# Patient Record
Sex: Male | Born: 1960 | Race: Black or African American | Hispanic: No | Marital: Single | Smoking: Former smoker
Health system: Southern US, Community
[De-identification: ages and names within clinical notes are randomized; demographics above are authoritative.]

## PROBLEM LIST (undated history)

## (undated) DIAGNOSIS — R0789 Other chest pain: Secondary | ICD-10-CM

## (undated) DIAGNOSIS — T7840XA Allergy, unspecified, initial encounter: Secondary | ICD-10-CM

## (undated) DIAGNOSIS — I10 Essential (primary) hypertension: Secondary | ICD-10-CM

## (undated) DIAGNOSIS — R0609 Other forms of dyspnea: Secondary | ICD-10-CM

## (undated) DIAGNOSIS — D649 Anemia, unspecified: Secondary | ICD-10-CM

## (undated) DIAGNOSIS — K5730 Diverticulosis of large intestine without perforation or abscess without bleeding: Secondary | ICD-10-CM

## (undated) DIAGNOSIS — J40 Bronchitis, not specified as acute or chronic: Secondary | ICD-10-CM

## (undated) DIAGNOSIS — F32A Depression, unspecified: Secondary | ICD-10-CM

## (undated) DIAGNOSIS — G5600 Carpal tunnel syndrome, unspecified upper limb: Secondary | ICD-10-CM

## (undated) DIAGNOSIS — K219 Gastro-esophageal reflux disease without esophagitis: Secondary | ICD-10-CM

## (undated) DIAGNOSIS — K921 Melena: Secondary | ICD-10-CM

## (undated) DIAGNOSIS — M545 Low back pain, unspecified: Secondary | ICD-10-CM

## (undated) DIAGNOSIS — M1990 Unspecified osteoarthritis, unspecified site: Secondary | ICD-10-CM

## (undated) DIAGNOSIS — R0600 Dyspnea, unspecified: Secondary | ICD-10-CM

## (undated) DIAGNOSIS — E785 Hyperlipidemia, unspecified: Secondary | ICD-10-CM

## (undated) DIAGNOSIS — R740 Nonspecific elevation of levels of transaminase and lactic acid dehydrogenase [LDH]: Secondary | ICD-10-CM

## (undated) DIAGNOSIS — J039 Acute tonsillitis, unspecified: Secondary | ICD-10-CM

## (undated) DIAGNOSIS — N400 Enlarged prostate without lower urinary tract symptoms: Secondary | ICD-10-CM

## (undated) DIAGNOSIS — R7401 Elevation of levels of liver transaminase levels: Secondary | ICD-10-CM

## (undated) DIAGNOSIS — E119 Type 2 diabetes mellitus without complications: Secondary | ICD-10-CM

## (undated) DIAGNOSIS — F329 Major depressive disorder, single episode, unspecified: Secondary | ICD-10-CM

## (undated) DIAGNOSIS — E669 Obesity, unspecified: Secondary | ICD-10-CM

## (undated) DIAGNOSIS — I499 Cardiac arrhythmia, unspecified: Secondary | ICD-10-CM

## (undated) DIAGNOSIS — N509 Disorder of male genital organs, unspecified: Secondary | ICD-10-CM

## (undated) DIAGNOSIS — J329 Chronic sinusitis, unspecified: Secondary | ICD-10-CM

## (undated) DIAGNOSIS — D126 Benign neoplasm of colon, unspecified: Secondary | ICD-10-CM

## (undated) DIAGNOSIS — G8929 Other chronic pain: Secondary | ICD-10-CM

## (undated) HISTORY — DX: Elevation of levels of liver transaminase levels: R74.01

## (undated) HISTORY — DX: Gastro-esophageal reflux disease without esophagitis: K21.9

## (undated) HISTORY — PX: OTHER SURGICAL HISTORY: SHX169

## (undated) HISTORY — DX: Dyspnea, unspecified: R06.00

## (undated) HISTORY — DX: Diverticulosis of large intestine without perforation or abscess without bleeding: K57.30

## (undated) HISTORY — DX: Unspecified osteoarthritis, unspecified site: M19.90

## (undated) HISTORY — DX: Type 2 diabetes mellitus without complications: E11.9

## (undated) HISTORY — DX: Hyperlipidemia, unspecified: E78.5

## (undated) HISTORY — DX: Obesity, unspecified: E66.9

## (undated) HISTORY — DX: Depression, unspecified: F32.A

## (undated) HISTORY — DX: Low back pain, unspecified: M54.50

## (undated) HISTORY — DX: Carpal tunnel syndrome, unspecified upper limb: G56.00

## (undated) HISTORY — DX: Chronic sinusitis, unspecified: J32.9

## (undated) HISTORY — DX: Major depressive disorder, single episode, unspecified: F32.9

## (undated) HISTORY — DX: Other chest pain: R07.89

## (undated) HISTORY — DX: Benign neoplasm of colon, unspecified: D12.6

## (undated) HISTORY — DX: Other chronic pain: G89.29

## (undated) HISTORY — DX: Allergy, unspecified, initial encounter: T78.40XA

## (undated) HISTORY — DX: Essential (primary) hypertension: I10

## (undated) HISTORY — DX: Benign prostatic hyperplasia without lower urinary tract symptoms: N40.0

## (undated) HISTORY — DX: Low back pain: M54.5

## (undated) HISTORY — DX: Other forms of dyspnea: R06.09

## (undated) HISTORY — DX: Cardiac arrhythmia, unspecified: I49.9

## (undated) HISTORY — DX: Anemia, unspecified: D64.9

## (undated) HISTORY — DX: Disorder of male genital organs, unspecified: N50.9

## (undated) HISTORY — DX: Melena: K92.1

## (undated) HISTORY — DX: Acute tonsillitis, unspecified: J03.90

## (undated) HISTORY — DX: Nonspecific elevation of levels of transaminase and lactic acid dehydrogenase (ldh): R74.0

---

## 1999-08-01 ENCOUNTER — Encounter: Admission: RE | Admit: 1999-08-01 | Discharge: 1999-08-01 | Payer: Self-pay | Admitting: Internal Medicine

## 1999-08-15 ENCOUNTER — Encounter: Admission: RE | Admit: 1999-08-15 | Discharge: 1999-11-13 | Payer: Self-pay | Admitting: Internal Medicine

## 1999-09-12 ENCOUNTER — Encounter: Admission: RE | Admit: 1999-09-12 | Discharge: 1999-09-12 | Payer: Self-pay | Admitting: Internal Medicine

## 2000-04-03 ENCOUNTER — Encounter: Admission: RE | Admit: 2000-04-03 | Discharge: 2000-04-03 | Payer: Self-pay | Admitting: Internal Medicine

## 2000-04-25 ENCOUNTER — Inpatient Hospital Stay (HOSPITAL_COMMUNITY): Admission: EM | Admit: 2000-04-25 | Discharge: 2000-04-26 | Payer: Self-pay | Admitting: Emergency Medicine

## 2000-04-25 ENCOUNTER — Encounter: Payer: Self-pay | Admitting: Emergency Medicine

## 2000-04-30 ENCOUNTER — Emergency Department (HOSPITAL_COMMUNITY): Admission: EM | Admit: 2000-04-30 | Discharge: 2000-04-30 | Payer: Self-pay | Admitting: Emergency Medicine

## 2000-04-30 ENCOUNTER — Encounter: Payer: Self-pay | Admitting: Emergency Medicine

## 2000-07-16 ENCOUNTER — Encounter: Admission: RE | Admit: 2000-07-16 | Discharge: 2000-07-16 | Payer: Self-pay | Admitting: Internal Medicine

## 2000-08-20 ENCOUNTER — Encounter: Admission: RE | Admit: 2000-08-20 | Discharge: 2000-08-20 | Payer: Self-pay | Admitting: Internal Medicine

## 2000-09-17 ENCOUNTER — Encounter: Admission: RE | Admit: 2000-09-17 | Discharge: 2000-09-17 | Payer: Self-pay | Admitting: Internal Medicine

## 2000-10-28 ENCOUNTER — Ambulatory Visit (HOSPITAL_COMMUNITY): Admission: RE | Admit: 2000-10-28 | Discharge: 2000-10-28 | Payer: Self-pay | Admitting: Cardiology

## 2000-11-04 ENCOUNTER — Ambulatory Visit (HOSPITAL_COMMUNITY): Admission: RE | Admit: 2000-11-04 | Discharge: 2000-11-04 | Payer: Self-pay | Admitting: Cardiology

## 2001-01-07 ENCOUNTER — Encounter: Admission: RE | Admit: 2001-01-07 | Discharge: 2001-01-07 | Payer: Self-pay | Admitting: Internal Medicine

## 2001-02-18 ENCOUNTER — Encounter: Admission: RE | Admit: 2001-02-18 | Discharge: 2001-02-18 | Payer: Self-pay | Admitting: Internal Medicine

## 2001-02-26 ENCOUNTER — Ambulatory Visit (HOSPITAL_COMMUNITY): Admission: RE | Admit: 2001-02-26 | Discharge: 2001-02-26 | Payer: Self-pay | Admitting: Internal Medicine

## 2001-02-26 ENCOUNTER — Encounter: Admission: RE | Admit: 2001-02-26 | Discharge: 2001-02-26 | Payer: Self-pay | Admitting: Internal Medicine

## 2001-03-11 ENCOUNTER — Emergency Department (HOSPITAL_COMMUNITY): Admission: EM | Admit: 2001-03-11 | Discharge: 2001-03-11 | Payer: Self-pay | Admitting: Emergency Medicine

## 2001-03-13 ENCOUNTER — Encounter: Payer: Self-pay | Admitting: Internal Medicine

## 2001-03-13 ENCOUNTER — Ambulatory Visit (HOSPITAL_COMMUNITY): Admission: RE | Admit: 2001-03-13 | Discharge: 2001-03-13 | Payer: Self-pay | Admitting: Internal Medicine

## 2001-03-13 ENCOUNTER — Encounter: Admission: RE | Admit: 2001-03-13 | Discharge: 2001-03-13 | Payer: Self-pay | Admitting: Internal Medicine

## 2001-03-25 ENCOUNTER — Encounter: Admission: RE | Admit: 2001-03-25 | Discharge: 2001-03-25 | Payer: Self-pay | Admitting: Internal Medicine

## 2001-08-11 ENCOUNTER — Encounter: Admission: RE | Admit: 2001-08-11 | Discharge: 2001-08-11 | Payer: Self-pay | Admitting: Internal Medicine

## 2002-02-23 ENCOUNTER — Encounter: Admission: RE | Admit: 2002-02-23 | Discharge: 2002-02-23 | Payer: Self-pay | Admitting: Internal Medicine

## 2002-02-25 ENCOUNTER — Ambulatory Visit (HOSPITAL_BASED_OUTPATIENT_CLINIC_OR_DEPARTMENT_OTHER): Admission: RE | Admit: 2002-02-25 | Discharge: 2002-02-25 | Payer: Self-pay | Admitting: Orthopedic Surgery

## 2002-02-25 HISTORY — PX: CARPAL TUNNEL RELEASE: SHX101

## 2002-03-10 ENCOUNTER — Encounter: Admission: RE | Admit: 2002-03-10 | Discharge: 2002-03-10 | Payer: Self-pay | Admitting: Internal Medicine

## 2002-04-14 ENCOUNTER — Ambulatory Visit (HOSPITAL_COMMUNITY): Admission: RE | Admit: 2002-04-14 | Discharge: 2002-04-14 | Payer: Self-pay | Admitting: Internal Medicine

## 2002-04-14 ENCOUNTER — Encounter: Admission: RE | Admit: 2002-04-14 | Discharge: 2002-04-14 | Payer: Self-pay | Admitting: Internal Medicine

## 2002-04-14 ENCOUNTER — Encounter: Payer: Self-pay | Admitting: Internal Medicine

## 2002-07-21 ENCOUNTER — Encounter: Admission: RE | Admit: 2002-07-21 | Discharge: 2002-07-21 | Payer: Self-pay | Admitting: Internal Medicine

## 2002-09-08 ENCOUNTER — Encounter: Admission: RE | Admit: 2002-09-08 | Discharge: 2002-09-08 | Payer: Self-pay | Admitting: Internal Medicine

## 2002-09-09 ENCOUNTER — Encounter: Admission: RE | Admit: 2002-09-09 | Discharge: 2002-09-09 | Payer: Self-pay | Admitting: Internal Medicine

## 2002-11-25 ENCOUNTER — Encounter: Admission: RE | Admit: 2002-11-25 | Discharge: 2002-11-25 | Payer: Self-pay | Admitting: Internal Medicine

## 2003-02-03 ENCOUNTER — Encounter: Admission: RE | Admit: 2003-02-03 | Discharge: 2003-02-03 | Payer: Self-pay | Admitting: Internal Medicine

## 2003-07-27 ENCOUNTER — Encounter: Admission: RE | Admit: 2003-07-27 | Discharge: 2003-07-27 | Payer: Self-pay | Admitting: Internal Medicine

## 2003-11-23 ENCOUNTER — Ambulatory Visit: Payer: Self-pay | Admitting: Internal Medicine

## 2004-06-27 ENCOUNTER — Ambulatory Visit: Payer: Self-pay | Admitting: Internal Medicine

## 2004-07-02 ENCOUNTER — Ambulatory Visit: Payer: Self-pay | Admitting: Internal Medicine

## 2004-12-20 ENCOUNTER — Ambulatory Visit: Payer: Self-pay | Admitting: Internal Medicine

## 2005-01-08 ENCOUNTER — Ambulatory Visit: Payer: Self-pay | Admitting: Internal Medicine

## 2005-07-03 ENCOUNTER — Ambulatory Visit: Payer: Self-pay | Admitting: Internal Medicine

## 2005-12-04 DIAGNOSIS — Z9189 Other specified personal risk factors, not elsewhere classified: Secondary | ICD-10-CM | POA: Insufficient documentation

## 2005-12-04 DIAGNOSIS — G5600 Carpal tunnel syndrome, unspecified upper limb: Secondary | ICD-10-CM

## 2005-12-04 DIAGNOSIS — K5900 Constipation, unspecified: Secondary | ICD-10-CM | POA: Insufficient documentation

## 2005-12-04 DIAGNOSIS — I10 Essential (primary) hypertension: Secondary | ICD-10-CM | POA: Insufficient documentation

## 2005-12-04 DIAGNOSIS — F32A Depression, unspecified: Secondary | ICD-10-CM | POA: Insufficient documentation

## 2005-12-04 DIAGNOSIS — R0609 Other forms of dyspnea: Secondary | ICD-10-CM

## 2005-12-04 DIAGNOSIS — F329 Major depressive disorder, single episode, unspecified: Secondary | ICD-10-CM

## 2005-12-04 DIAGNOSIS — R0989 Other specified symptoms and signs involving the circulatory and respiratory systems: Secondary | ICD-10-CM

## 2005-12-04 DIAGNOSIS — M545 Low back pain: Secondary | ICD-10-CM

## 2005-12-04 DIAGNOSIS — K219 Gastro-esophageal reflux disease without esophagitis: Secondary | ICD-10-CM

## 2006-02-10 DIAGNOSIS — J309 Allergic rhinitis, unspecified: Secondary | ICD-10-CM | POA: Insufficient documentation

## 2006-02-24 ENCOUNTER — Telehealth (INDEPENDENT_AMBULATORY_CARE_PROVIDER_SITE_OTHER): Payer: Self-pay | Admitting: *Deleted

## 2006-04-16 ENCOUNTER — Ambulatory Visit: Payer: Self-pay | Admitting: Internal Medicine

## 2006-04-17 ENCOUNTER — Encounter: Payer: Self-pay | Admitting: Internal Medicine

## 2006-04-17 LAB — CONVERTED CEMR LAB
Albumin: 4.5 g/dL (ref 3.5–5.2)
BUN: 12 mg/dL (ref 6–23)
CO2: 24 meq/L (ref 19–32)
Eosinophils Relative: 2 % (ref 0–5)
Glucose, Bld: 94 mg/dL (ref 70–99)
HCT: 43.8 % (ref 39.0–52.0)
Hemoglobin: 13.7 g/dL (ref 13.0–17.0)
Lymphocytes Relative: 49 % — ABNORMAL HIGH (ref 12–46)
Lymphs Abs: 3.4 10*3/uL — ABNORMAL HIGH (ref 0.7–3.3)
MCV: 94 fL (ref 78.0–100.0)
Monocytes Relative: 7 % (ref 3–11)
PSA: 0.96 ng/mL (ref 0.10–4.00)
Platelets: 361 10*3/uL (ref 150–400)
Potassium: 4.5 meq/L (ref 3.5–5.3)
RBC: 4.66 M/uL (ref 4.22–5.81)
Sodium: 139 meq/L (ref 135–145)
Total Bilirubin: 0.4 mg/dL (ref 0.3–1.2)
Total Protein: 7.7 g/dL (ref 6.0–8.3)
WBC: 7 10*3/uL (ref 4.0–10.5)

## 2006-07-22 ENCOUNTER — Telehealth: Payer: Self-pay | Admitting: *Deleted

## 2006-10-13 ENCOUNTER — Telehealth: Payer: Self-pay | Admitting: Internal Medicine

## 2007-07-13 ENCOUNTER — Encounter (INDEPENDENT_AMBULATORY_CARE_PROVIDER_SITE_OTHER): Payer: Self-pay | Admitting: *Deleted

## 2007-07-13 ENCOUNTER — Ambulatory Visit: Payer: Self-pay | Admitting: *Deleted

## 2007-07-13 LAB — CONVERTED CEMR LAB
Calcium: 9.5 mg/dL (ref 8.4–10.5)
Leukocytes, UA: NEGATIVE
Nitrite: NEGATIVE
Potassium: 3.9 meq/L (ref 3.5–5.3)
Sodium: 139 meq/L (ref 135–145)
Specific Gravity, Urine: 1.02 (ref 1.005–1.03)
pH: 5.5 (ref 5.0–8.0)

## 2007-10-12 ENCOUNTER — Emergency Department (HOSPITAL_BASED_OUTPATIENT_CLINIC_OR_DEPARTMENT_OTHER): Admission: EM | Admit: 2007-10-12 | Discharge: 2007-10-12 | Payer: Self-pay | Admitting: Emergency Medicine

## 2007-10-14 ENCOUNTER — Encounter: Payer: Self-pay | Admitting: Internal Medicine

## 2007-10-14 ENCOUNTER — Encounter (INDEPENDENT_AMBULATORY_CARE_PROVIDER_SITE_OTHER): Payer: Self-pay | Admitting: Internal Medicine

## 2007-10-14 ENCOUNTER — Ambulatory Visit: Payer: Self-pay | Admitting: Internal Medicine

## 2007-10-14 DIAGNOSIS — E119 Type 2 diabetes mellitus without complications: Secondary | ICD-10-CM | POA: Insufficient documentation

## 2007-10-14 LAB — CONVERTED CEMR LAB
Blood Glucose, Fingerstick: 310
Blood Glucose, Home Monitor: 2 mg/dL
Hgb A1c MFr Bld: 10.8 %

## 2007-10-16 ENCOUNTER — Telehealth (INDEPENDENT_AMBULATORY_CARE_PROVIDER_SITE_OTHER): Payer: Self-pay | Admitting: *Deleted

## 2007-10-28 ENCOUNTER — Ambulatory Visit: Payer: Self-pay | Admitting: Infectious Disease

## 2007-10-28 ENCOUNTER — Encounter (INDEPENDENT_AMBULATORY_CARE_PROVIDER_SITE_OTHER): Payer: Self-pay | Admitting: Internal Medicine

## 2007-10-28 DIAGNOSIS — E785 Hyperlipidemia, unspecified: Secondary | ICD-10-CM

## 2007-10-29 LAB — CONVERTED CEMR LAB
Albumin: 4.6 g/dL (ref 3.5–5.2)
Alkaline Phosphatase: 79 units/L (ref 39–117)
BUN: 14 mg/dL (ref 6–23)
Bilirubin Urine: NEGATIVE
Eosinophils Absolute: 0.1 10*3/uL (ref 0.0–0.7)
Eosinophils Relative: 1 % (ref 0–5)
Glucose, Bld: 271 mg/dL — ABNORMAL HIGH (ref 70–99)
HCT: 44.3 % (ref 39.0–52.0)
HDL: 52 mg/dL (ref >39)
Hemoglobin, Urine: NEGATIVE
Hemoglobin: 14.7 g/dL (ref 13.0–17.0)
Hep A IgM: NEGATIVE
Hep B C IgM: NEGATIVE
Hepatitis B Surface Ag: NEGATIVE
Ketones, ur: 15 mg/dL — AB
LDL Cholesterol: 209 mg/dL — ABNORMAL HIGH (ref 0–99)
Lymphs Abs: 3.1 10*3/uL (ref 0.7–4.0)
MCV: 88.4 fL (ref 78.0–100.0)
Monocytes Relative: 6 % (ref 3–12)
Neutrophils Relative %: 46 % (ref 43–77)
Potassium: 4.1 meq/L (ref 3.5–5.3)
RBC / HPF: NONE SEEN (ref <3)
RBC: 5.01 M/uL (ref 4.22–5.81)
Triglycerides: 168 mg/dL — ABNORMAL HIGH (ref <150)
Urine Glucose: >1000 mg/dL — AB
WBC: 6.6 10*3/uL (ref 4.0–10.5)
pH: 6 (ref 5.0–8.0)

## 2007-11-04 ENCOUNTER — Ambulatory Visit: Payer: Self-pay | Admitting: Infectious Disease

## 2007-11-04 ENCOUNTER — Encounter: Payer: Self-pay | Admitting: Internal Medicine

## 2007-11-05 ENCOUNTER — Ambulatory Visit (HOSPITAL_COMMUNITY): Admission: RE | Admit: 2007-11-05 | Discharge: 2007-11-05 | Payer: Self-pay | Admitting: Internal Medicine

## 2007-11-10 ENCOUNTER — Encounter: Payer: Self-pay | Admitting: Internal Medicine

## 2007-12-08 ENCOUNTER — Ambulatory Visit: Payer: Self-pay | Admitting: Internal Medicine

## 2007-12-09 LAB — CONVERTED CEMR LAB
CO2: 22 meq/L (ref 19–32)
Calcium: 10.3 mg/dL (ref 8.4–10.5)
Creatinine, Ser: 1.1 mg/dL (ref 0.40–1.50)
Glucose, Bld: 89 mg/dL (ref 70–99)
PSA: 1.07 ng/mL (ref 0.10–4.00)
Total Bilirubin: 0.4 mg/dL (ref 0.3–1.2)

## 2007-12-10 ENCOUNTER — Telehealth: Payer: Self-pay | Admitting: *Deleted

## 2008-01-07 ENCOUNTER — Ambulatory Visit: Payer: Self-pay | Admitting: Internal Medicine

## 2008-01-07 LAB — CONVERTED CEMR LAB
Albumin: 4.8 g/dL (ref 3.5–5.2)
Alkaline Phosphatase: 55 units/L (ref 39–117)
Indirect Bilirubin: 0.3 mg/dL (ref 0.0–0.9)
Total Bilirubin: 0.4 mg/dL (ref 0.3–1.2)

## 2008-02-09 ENCOUNTER — Encounter: Payer: Self-pay | Admitting: Internal Medicine

## 2008-02-09 ENCOUNTER — Ambulatory Visit: Payer: Self-pay | Admitting: Internal Medicine

## 2008-02-09 LAB — CONVERTED CEMR LAB
AST: 23 units/L (ref 0–37)
Alkaline Phosphatase: 53 units/L (ref 39–117)
Glucose, Bld: 89 mg/dL (ref 70–99)
HDL: 41 mg/dL (ref >39)
LDL Cholesterol: 74 mg/dL (ref 0–99)
Sodium: 140 meq/L (ref 135–145)
Total Bilirubin: 0.3 mg/dL (ref 0.3–1.2)
Total Protein: 7.8 g/dL (ref 6.0–8.3)
Triglycerides: 146 mg/dL (ref <150)
VLDL: 29 mg/dL (ref 0–40)

## 2008-03-03 ENCOUNTER — Telehealth: Payer: Self-pay | Admitting: Internal Medicine

## 2008-03-04 ENCOUNTER — Encounter: Payer: Self-pay | Admitting: Internal Medicine

## 2008-03-09 ENCOUNTER — Ambulatory Visit: Payer: Self-pay | Admitting: Internal Medicine

## 2008-03-09 DIAGNOSIS — K921 Melena: Secondary | ICD-10-CM

## 2008-03-09 LAB — CONVERTED CEMR LAB
Basophils Absolute: 0 10*3/uL (ref 0.0–0.1)
Basophils Relative: 0 % (ref 0–1)
Calcium: 10.4 mg/dL (ref 8.4–10.5)
Eosinophils Absolute: 0.2 10*3/uL (ref 0.0–0.7)
Eosinophils Relative: 2 % (ref 0–5)
HCT: 44 % (ref 39.0–52.0)
Hemoglobin, Urine: NEGATIVE
Hgb A1c MFr Bld: 5.3 %
Leukocytes, UA: NEGATIVE
MCV: 94 fL (ref 78.0–100.0)
Nitrite: NEGATIVE
Platelets: 339 10*3/uL (ref 150–400)
RDW: 13.6 % (ref 11.5–15.5)
Sodium: 139 meq/L (ref 135–145)
Specific Gravity, Urine: 1.028 (ref 1.005–1.03)
pH: 6 (ref 5.0–8.0)

## 2008-06-15 ENCOUNTER — Emergency Department (HOSPITAL_BASED_OUTPATIENT_CLINIC_OR_DEPARTMENT_OTHER): Admission: EM | Admit: 2008-06-15 | Discharge: 2008-06-15 | Payer: Self-pay | Admitting: Emergency Medicine

## 2008-06-28 ENCOUNTER — Ambulatory Visit: Payer: Self-pay | Admitting: Internal Medicine

## 2008-06-28 ENCOUNTER — Encounter (INDEPENDENT_AMBULATORY_CARE_PROVIDER_SITE_OTHER): Payer: Self-pay | Admitting: *Deleted

## 2008-06-28 DIAGNOSIS — A6000 Herpesviral infection of urogenital system, unspecified: Secondary | ICD-10-CM

## 2008-06-28 LAB — CONVERTED CEMR LAB
Blood Glucose, AC Bkfst: 98 mg/dL
Chlamydia, Swab/Urine, PCR: NEGATIVE
Hemoglobin, Urine: NEGATIVE
Leukocytes, UA: NEGATIVE
Urine Glucose: NEGATIVE mg/dL
pH: 6 (ref 5.0–8.0)

## 2008-07-01 ENCOUNTER — Telehealth (INDEPENDENT_AMBULATORY_CARE_PROVIDER_SITE_OTHER): Payer: Self-pay | Admitting: *Deleted

## 2008-07-23 ENCOUNTER — Emergency Department (HOSPITAL_BASED_OUTPATIENT_CLINIC_OR_DEPARTMENT_OTHER): Admission: EM | Admit: 2008-07-23 | Discharge: 2008-07-23 | Payer: Self-pay | Admitting: Emergency Medicine

## 2008-07-27 ENCOUNTER — Telehealth: Payer: Self-pay | Admitting: Internal Medicine

## 2008-10-06 ENCOUNTER — Emergency Department (HOSPITAL_BASED_OUTPATIENT_CLINIC_OR_DEPARTMENT_OTHER): Admission: EM | Admit: 2008-10-06 | Discharge: 2008-10-06 | Payer: Self-pay | Admitting: Emergency Medicine

## 2008-10-07 ENCOUNTER — Ambulatory Visit: Payer: Self-pay | Admitting: Diagnostic Radiology

## 2008-10-07 ENCOUNTER — Encounter: Payer: Self-pay | Admitting: Emergency Medicine

## 2008-10-07 ENCOUNTER — Encounter: Payer: Self-pay | Admitting: Internal Medicine

## 2008-10-07 DIAGNOSIS — J039 Acute tonsillitis, unspecified: Secondary | ICD-10-CM

## 2008-10-07 HISTORY — DX: Acute tonsillitis, unspecified: J03.90

## 2008-10-08 ENCOUNTER — Ambulatory Visit: Payer: Self-pay | Admitting: Infectious Diseases

## 2008-10-08 ENCOUNTER — Inpatient Hospital Stay (HOSPITAL_COMMUNITY): Admission: EM | Admit: 2008-10-08 | Discharge: 2008-10-09 | Payer: Self-pay | Admitting: Infectious Diseases

## 2008-10-09 ENCOUNTER — Encounter: Payer: Self-pay | Admitting: Internal Medicine

## 2008-10-18 ENCOUNTER — Ambulatory Visit: Payer: Self-pay | Admitting: Internal Medicine

## 2008-10-19 ENCOUNTER — Encounter: Payer: Self-pay | Admitting: Internal Medicine

## 2008-10-19 LAB — CONVERTED CEMR LAB
Creatinine, Urine: 177.9 mg/dL
Microalb, Ur: 2.67 mg/dL — ABNORMAL HIGH (ref 0.00–1.89)

## 2008-12-21 ENCOUNTER — Emergency Department (HOSPITAL_BASED_OUTPATIENT_CLINIC_OR_DEPARTMENT_OTHER): Admission: EM | Admit: 2008-12-21 | Discharge: 2008-12-21 | Payer: Self-pay | Admitting: Emergency Medicine

## 2008-12-21 ENCOUNTER — Ambulatory Visit: Payer: Self-pay | Admitting: Diagnostic Radiology

## 2008-12-26 ENCOUNTER — Telehealth: Payer: Self-pay | Admitting: *Deleted

## 2008-12-27 ENCOUNTER — Ambulatory Visit (HOSPITAL_COMMUNITY): Admission: RE | Admit: 2008-12-27 | Discharge: 2008-12-27 | Payer: Self-pay | Admitting: Infectious Diseases

## 2008-12-27 ENCOUNTER — Ambulatory Visit: Payer: Self-pay | Admitting: Infectious Diseases

## 2008-12-27 ENCOUNTER — Encounter: Payer: Self-pay | Admitting: Internal Medicine

## 2008-12-27 LAB — CONVERTED CEMR LAB: Blood Glucose, Fingerstick: 94

## 2009-04-12 ENCOUNTER — Telehealth: Payer: Self-pay | Admitting: Internal Medicine

## 2009-04-20 ENCOUNTER — Telehealth: Payer: Self-pay | Admitting: Internal Medicine

## 2009-04-26 ENCOUNTER — Ambulatory Visit: Payer: Self-pay | Admitting: Internal Medicine

## 2009-04-26 LAB — CONVERTED CEMR LAB
CO2: 25 meq/L (ref 19–32)
Calcium: 9.6 mg/dL (ref 8.4–10.5)
Chloride: 100 meq/L (ref 96–112)
Glucose, Bld: 141 mg/dL — ABNORMAL HIGH (ref 70–99)
Potassium: 4 meq/L (ref 3.5–5.3)
Sodium: 138 meq/L (ref 135–145)

## 2009-04-27 DIAGNOSIS — I499 Cardiac arrhythmia, unspecified: Secondary | ICD-10-CM | POA: Insufficient documentation

## 2009-04-27 DIAGNOSIS — D649 Anemia, unspecified: Secondary | ICD-10-CM

## 2009-09-11 ENCOUNTER — Telehealth: Payer: Self-pay | Admitting: Internal Medicine

## 2009-09-21 ENCOUNTER — Telehealth: Payer: Self-pay | Admitting: Internal Medicine

## 2009-12-21 ENCOUNTER — Telehealth: Payer: Self-pay | Admitting: Internal Medicine

## 2010-02-11 ENCOUNTER — Encounter: Payer: Self-pay | Admitting: Internal Medicine

## 2010-02-20 NOTE — Progress Notes (Signed)
Summary: Prior Authorization- Lipitor  Phone Note Outgoing Call   Call placed by: Angelina Ok RN,  April 20, 2009 9:56 AM Call placed to: Insurer Summary of Call: Prior Approval for Lipitor 10 mg tablets  approved 04/20/2009- 04/20/2010 Angelina Ok RN  April 20, 2009 9:57 AM   Initial call taken by: Angelina Ok RN,  April 20, 2009 9:57 AM    New/Updated Medications: LIPITOR 10 MG TABS (ATORVASTATIN CALCIUM) Take 1 tablet by mouth once a day

## 2010-02-20 NOTE — Assessment & Plan Note (Signed)
Summary: checkup [mkj]   Vital Signs:  Patient profile:   50 year old male Height:      68 inches Weight:      324.0 pounds BMI:     49.44 Temp:     97.3 degrees F oral Pulse rate:   79 / minute BP sitting:   137 / 79  (right arm)  Vitals Entered By: Filomena Jungling NT II (April 26, 2009 10:32 AM) CC: checkup visit Is Patient Diabetic? Yes Nutritional Status BMI of > 30 = obese CBG Result 162  Have you ever been in a relationship where you felt threatened, hurt or afraid?No   Does patient need assistance? Functional Status Self care Ambulation Normal   Primary Care Provider:  Margarito Liner MD  CC:  checkup visit.  History of Present Illness: Patient returns for followup of his adult onset diabetes mellitus, hypertension, hyperlipidemia, and other chronic medical problems. His main complaint today is chronic low back pain, which is stable and appears to be reasonably well controlled on his current regimen. He brought his home glucose monitor today, and his daily average blood sugar has been 131, with values generally in the low to mid 100s. He reports that he is compliant with his medications. Patient mentioned on review of systems he has occasionally seen blood in small amounts from an area of irritation on his scrotum, but this has not happened any time recently.      Preventive Screening-Counseling & Management  Alcohol-Tobacco     Smoking Status: quit  Current Medications (verified): 1)  Celebrex 200 Mg Caps (Celecoxib) .... Take 1 Capsule By Mouth Once A Day As Needed For Pain 2)  Paxil 20 Mg Tabs (Paroxetine Hcl) .... Take 1 Tablet By Mouth Once A Day 3)  Flomax 0.4 Mg Cp24 (Tamsulosin Hcl) .... Take 1 Tablet By Mouth Once A Day 4)  Aspirin Ec 325 Mg Tbec (Aspirin) .... Take 1 Tablet By Mouth Once A Day 5)  Proair Hfa 108 (90 Base) Mcg/act  Aers (Albuterol Sulfate) .... Inhale 2 Puffs Four Times A Day As Needed 6)  Fluticasone Propionate 50 Mcg/act Susp (Fluticasone  Propionate) .... Take 2 Sprays in Each Nostril Once Daily 7)  Hydrochlorothiazide 25 Mg Tabs (Hydrochlorothiazide) .... Take 1 Tablet By Mouth Once A Day 8)  Prilosec 20 Mg Cpdr (Omeprazole) .... Take 1 Capsule By Mouth Two Times A Day 9)  Metformin Hcl 1000 Mg Tabs (Metformin Hcl) .... Take 1 Tablet By Mouth Two Times A Day 10)  Lipitor 10 Mg Tabs (Atorvastatin Calcium) .... Take 1 Tablet By Mouth Once A Day 11)  Prodigy Autocode Blood Glucose  Strp (Glucose Blood) .... Use To Test Blood Glucose Twice A Day Before Breakfast and Supper 12)  Prodigy Autocode Blood Glucose  Devi (Blood Glucose Monitoring Suppl) .... Use To Test Blood Glucose Twice A Day Before Breakfast and Supper 13)  Prodigy Twist Top Lancets 28g  Misc (Lancets) .... Use To Test Blood Glucose Twice A Day Before Breakfast and Supper 14)  Miralax  Pack (Polyethylene Glycol 3350) .... Dissolve One Packet in 4 To 8 Oz of Liquid and Take Daily As Needed For Constipation 15)  Valtrex 500 Mg Tabs (Valacyclovir Hcl) .... Take 1 Tablet By Mouth Once A Day 16)  Cetirizine Hcl 10 Mg Tabs (Cetirizine Hcl) .... Take 1 Tablet By Mouth Once A Day  Allergies (verified): 1)  ! 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 CV:  Denies chest pain or discomfort. Resp:  Denies shortness of breath. GI:  Denies abdominal pain, nausea, and vomiting; he occasionally notes bright red blood in small amounts on toilet paper. Endo:  Denies polyuria.  Physical Exam  General:  alert, no distress Lungs:  normal respiratory effort, normal breath sounds, no crackles, and no  wheezes.   Heart:  normal rate, regular rhythm, no murmur, no gallop, and no rub.   Abdomen:  soft, non-tender, normal bowel sounds, no masses, and no guarding.   Genitalia:  genital exam was normal; there are no palpable testicular masses; the skin is intact and there is no apparent area of breakdown or source of bleeding Extremities:  no edema   Impression & Recommendations:  Problem # 1:  DIABETES MELLITUS (ICD-250.00) Patient's diabetes mellitus is well controlled on current regimen.  Plan is to continue current medications, and continue home capillary blood glucose monitoring.  Will check labs as below, and refer for diabetic eye exam.   His updated medication list for this problem includes:    Aspirin Ec 325 Mg Tbec (Aspirin) .Marland Kitchen... Take 1 tablet by mouth once a day    Metformin Hcl 1000 Mg Tabs (Metformin hcl) .Marland Kitchen... Take 1 tablet by mouth two times a day    Enalapril Maleate 2.5 Mg Tabs (Enalapril maleate) .Marland Kitchen... Take 1 tablet by mouth once a day  Orders: T-Hgb A1C (in-house) ([id number]) Ophthalmology Referral (Ophthalmology)Future Orders: T-Comprehensive Metabolic Panel [phone]) ... 04/27/2009  Labs Reviewed: Creat: 1.09 (03/09/2008)     Last Eye Exam: No diabetic retinopathy.   Glaucoma suspect. Minimal cataract. Visual acuity OD (best corrected):     20/25 Visual acuity OS (best corrected):     20/25 Intraocular pressure OD:     14 Intraocular pressure OS:     17 Exam by Heather Burundi, OD  (11/10/2007) Reviewed HgBA1c results: 6.1 (04/26/2009)  6.6 (12/27/2008)  Problem # 2:  HYPERTENSION (ICD-401.9) Patient's blood pressure is improved, but the systolic is still above target range. The plan is to add a low dose of enalapril 2.5 mg daily.  Will check a basic metabolic panel today, and again in one week when patient returns for fasting blood work.  His updated medication list for this problem includes:    Hydrochlorothiazide 25 Mg Tabs (Hydrochlorothiazide) .Marland Kitchen...  Take 1 tablet by mouth once a day    Enalapril Maleate 2.5 Mg Tabs (Enalapril maleate) .Marland Kitchen... Take 1 tablet by mouth once a day  BP today: 137/79 Prior BP: 184/100 (12/27/2008)  Labs Reviewed: K+: 4.5 (03/09/2008) Creat: : 1.09 (03/09/2008)   Chol: 144 (02/09/2008)   HDL: 41 (02/09/2008)   LDL: 74 (02/09/2008)   TG: 146 (02/09/2008)  Orders: T-Basic Metabolic Panel [id number])  Problem # 3:  HYPERLIPIDEMIA (ICD-272.4) Patient is doing well on Lipitor, with no apparent problems. His last LDL was at goal. He will return in one week for a fasting lipid panel and comprehensive metabolic panel.  His updated medication list for this problem includes:    Lipitor 10 Mg Tabs (Atorvastatin calcium) .Marland Kitchen... Take 1 tablet by mouth once a day  Labs Reviewed: SGOT: 23 (02/09/2008)   SGPT: 25 (02/09/2008)   HDL:41 (02/09/2008), 52 (10/14/2007)  LDL:74 (02/09/2008), 209 (71/06/2692)  Chol:144 (02/09/2008), 295 (10/14/2007)  Trig:146 (02/09/2008), 168 (10/14/2007)  Future Orders: T-Lipid Profile ([id number]) ... 04/27/2009  Problem # 4:  HEMATOCHEZIA (ICD-578.1) Patient reports occasional bright red blood per rectum in small amounts noted on toilet paper.  Plan is GI referral for lower GI workup.  Will check a CBC today.  Orders: Gastroenterology Referral (GI)Future Orders: T-CBC w/Diff [id number]) ... 04/27/2009  Problem # 5:  GERD (ICD-530.81) Symptoms are controlled on current dose of omeprazole; will continue as before.  His updated medication list for this problem includes:    Prilosec 20 Mg Cpdr (Omeprazole) .Marland Kitchen... Take 1 capsule by mouth two times a day  Problem # 6:  DEPRESSION (ICD-311) Patient has no symptoms on current dose of paroxetine; will continue at current dose.  His updated medication list for this problem includes:    Paxil 20 Mg Tabs (Paroxetine hcl) .Marland Kitchen... Take 1 tablet by mouth once a day  Problem # 7:  ANEMIA NOS (ICD-285.9) As above, will check a CBC  today.  Hgb: 13.9 (03/09/2008)   Hct: 44.0 (03/09/2008)   Platelets: 339 (03/09/2008) RBC: 4.68 (03/09/2008)   RDW: 13.6 (03/09/2008)   WBC: 7.4 (03/09/2008) MCV: 94.0 (03/09/2008)   MCHC: 31.6 (03/09/2008) TSH: 0.754 (02/09/2008)  Problem # 8:  LOW BACK PAIN, CHRONIC (ICD-724.2) Patient has chronic stable low back pain which is reasonably well-controlled on current analgesic regimen.  His updated medication list for this problem includes:    Celebrex 200 Mg Caps (Celecoxib) .Marland Kitchen... Take 1 capsule by mouth once a day as needed for pain    Aspirin Ec 325 Mg Tbec (Aspirin) .Marland Kitchen... 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 Genital exam is normal. I advised him to return if he has any recurrence of this.  Problem # 10:  Hx of GENITAL HERPES (ICD-054.10) Patient has had no recent symptoms; plan is to continue suppressive dose of Valtrex.  Complete Medication List: 1)  Celebrex 200 Mg Caps (Celecoxib) .... Take 1 capsule by mouth once a day as needed for pain 2)  Paxil 20 Mg Tabs (Paroxetine hcl) .... Take 1 tablet by mouth once a day 3)  Flomax 0.4 Mg Cp24 (Tamsulosin hcl) .... Take 1 tablet by mouth once a day 4)  Aspirin Ec 325 Mg Tbec (Aspirin) .... Take 1 tablet by mouth once a day 5)  Proair Hfa 108 (90 Base) Mcg/act Aers (Albuterol sulfate) .... Inhale 2 puffs four times a day as needed 6)  Fluticasone Propionate 50 Mcg/act Susp (Fluticasone propionate) .... Take 2 sprays in each nostril once daily 7)  Hydrochlorothiazide 25 Mg Tabs (Hydrochlorothiazide) .... Take 1 tablet by mouth once a day 8)  Prilosec 20 Mg Cpdr (Omeprazole) .... Take 1 capsule by mouth two times a day 9)  Metformin Hcl 1000 Mg Tabs (Metformin hcl) .... Take 1 tablet by mouth two times a day 10)  Lipitor 10 Mg  Tabs (Atorvastatin calcium) .... Take 1 tablet by mouth once a day 11)  Prodigy Autocode Blood Glucose Strp (Glucose blood) .... Use to test blood glucose twice a day before breakfast and supper 12)  Prodigy Autocode Blood Glucose Devi (Blood glucose monitoring suppl) .... Use to test blood glucose twice a day before breakfast and supper 13)  Prodigy Twist Top Lancets 28g  Misc (Lancets) .... Use to test blood glucose twice a day before breakfast and supper 14)  Miralax Pack (Polyethylene glycol 3350) .... Dissolve one packet in 4 to 8 oz of liquid and take daily as needed for constipation 15)  Valtrex 500 Mg Tabs (Valacyclovir hcl) .... Take 1 tablet by mouth once a day 16)  Cetirizine Hcl 10 Mg Tabs (Cetirizine hcl) .... Take 1 tablet by mouth once a day 17)  Enalapril Maleate 2.5 Mg Tabs (Enalapril maleate) .... Take 1 tablet by mouth once a day  Other Orders: T-PSA Total ([id number]) T- Capillary Blood Glucose ([id number])  Patient Instructions: 1)  Please schedule a follow-up appointment in 2 months. 2)  Please return for fasting bloodwork in 1 week. 3)  Start enalapril 2.5 mg daily for high blood pressure.  Prescriptions: ENALAPRIL MALEATE 2.5 MG TABS (ENALAPRIL MALEATE) Take 1 tablet by mouth once a day  #30 x 3   Entered and Authorized by:   Margarito Liner MD   Signed by:   Margarito Liner MD on 04/26/2009   Method used:   [facility] to        [facility] [phone]* (retail)       [address]       [place], [place]  [address]       Ph: [phone]       Fax: [phone]   RxID:   [id number] LIPITOR 10 MG TABS (ATORVASTATIN CALCIUM) Take 1 tablet by mouth once a day  #30 x 11   Entered and Authorized by:   Margarito Liner MD   Signed by:   Margarito Liner MD on 04/26/2009   Method used:   [facility]ronically to        [facility] [phone]* (retail)       [address]       [place], [place]  [address]       Ph: [phone]        Fax: [phone]   RxID:   [id number] HYDROCHLOROTHIAZIDE 25 MG TABS (HYDROCHLOROTHIAZIDE) Take 1 tablet by mouth once a day  #30 Tablet x 6   Entered and Authorized by:   Margarito Liner MD   Signed by:   Margarito Liner MD on 04/26/2009   Method used:   Electronically to        [facility] [phone]* (retail)       [address]       [place], [place]  [address]       Ph: [phone]       Fax: [phone]   RxID:   [id number] FLOMAX 0.4 MG CP24 (TAMSULOSIN HCL) Take 1 tablet by mouth once a day  #31 Capsule x 11   Entered and Authorized by:   Margarito Liner MD   Signed by:   Margarito Liner MD on 04/26/2009   Method used:   Electronically to        [facility] [phone]* (retail)       [address]       [place], [place]  [address]       Ph: [phone]       Fax: [phone]   RxID:   [id number] PAXIL 20 MG TABS (PAROXETINE HCL) Take 1 tablet by mouth once a day  #30 Tablet  x 6   Entered and Authorized by:   Margarito Liner MD   Signed by:   Margarito Liner MD on 04/26/2009   Method used:   Electronically to        [facility] [phone]* (retail)       [address]       [place], [place]  [address]       Ph: [phone]       Fax: [phone]   RxID:   [id number] CELEBREX 200 MG CAPS (CELECOXIB) Take 1 capsule by mouth once a day as needed for pain  #30 Capsule x 6   Entered and Authorized by:   Margarito Liner MD   Signed by:   Margarito Liner MD on 04/26/2009   Method used:   Electronically to        [facility] [phone]* (retail)       [address]       [place], [place]  [address]       Ph: [phone]       Fax: [phone]   RxID:   [id number]   Laboratory Results   Blood Tests   Date/Time Received: Burke Keels  April 26, 2009 10:54 AM  Date/Time Reported: Burke Keels  April 26, 2009 10:54 AM   HGBA1C: 6.1%   (Normal  Range: Non-Diabetic - 3-6%   Control Diabetic - 6-8%) CBG Random:: 162mg /dL     Prevention & Chronic Care Immunizations   Influenza vaccine: Fluvax Non-MCR  (10/18/2008)   Influenza vaccine due: 09/21/2009    Tetanus booster: Not documented    Pneumococcal vaccine: Not documented  Other Screening   PSA: 1.07  (12/08/2007)   PSA ordered.   PSA action/deferral: Discussed-PSA requested  (04/26/2009)   PSA due due: 12/07/2008   Smoking status: quit  (04/26/2009)  Diabetes Mellitus   HgbA1C: 6.1  (04/26/2009)   Hemoglobin A1C due: 03/09/2008    Eye exam: No diabetic retinopathy.   Glaucoma suspect. Minimal cataract. Visual acuity OD (best corrected):     20/25 Visual acuity OS (best corrected):     20/25 Intraocular pressure OD:     14 Intraocular pressure OS:     17 Exam by Heather Burundi, OD   (11/10/2007)   Diabetic eye exam action/deferral: Ophthalmology referral  (04/26/2009)   Eye exam due: 11/2008    Foot exam: yes  (10/18/2008)   Foot exam action/deferral: Do today   High risk foot: No  (10/18/2008)   Foot care education: Done  (10/18/2008)   Foot exam due: 10/18/2009    Urine microalbumin/creatinine ratio: 15.0  (10/19/2008)   Urine microalbumin action/deferral: Ordered   Urine microalbumin/cr due: 10/13/2008    Diabetes flowsheet reviewed?: Yes   Progress toward A1C goal: At goal  Lipids   Total Cholesterol: 144  (02/09/2008)   Lipid panel action/deferral: Lipid Panel ordered   LDL: 74  (02/09/2008)   LDL Direct: Not documented   HDL: 41  (02/09/2008)   Triglycerides: 146  (02/09/2008)    SGOT (AST): 23  (02/09/2008)   BMP action: Ordered   SGPT (ALT): 25  (02/09/2008) CMP ordered    Alkaline phosphatase: 53  (02/09/2008)   Total bilirubin: 0.3  (02/09/2008)    Lipid flowsheet reviewed?: Yes   Progress toward LDL goal: At goal  Hypertension   Last Blood Pressure: 137 / 79  (  04/26/2009)   Serum creatinine: 1.09  (03/09/2008)   BMP action:  Ordered   Serum potassium 4.5  (03/09/2008) CMP ordered     Hypertension flowsheet reviewed?: Yes   Progress toward BP goal: Improved  Self-Management Support :   Personal Goals (by the next clinic visit) :     Personal A1C goal: 7  (04/26/2009)     Personal blood pressure goal: 130/80  (04/26/2009)     Personal LDL goal: 100  (04/26/2009)    Patient will work on the following items until the next clinic visit to reach self-care goals:     Medications and monitoring: take my medicines every day  (04/26/2009)     Eating: eat more vegetables, eat foods that are low in salt, eat baked foods instead of fried foods, eat fruit for snacks and desserts  (10/18/2008)     Activity: take a 30 minute walk every day  (10/18/2008)     Home glucose monitoring frequency: 3 times a day  (04/26/2009)    Diabetes self-management support: Written self-care plan  (04/26/2009)   Diabetes care plan printed   Last diabetes self-management training by diabetes educator: 11/04/2007    Hypertension self-management support: Written self-care plan  (04/26/2009)   Hypertension self-care plan printed.    Lipid self-management support: Written self-care plan  (04/26/2009)   Lipid self-care plan printed.   Nursing Instructions: Refer for screening diabetic eye exam (see order)   Process Orders Check Orders Results:     [facility]: ABN not required for this insurance Tests Sent for requisitioning (April 27, 2009 9:16 AM):     04/27/2009: [facility] -- T-Lipid Profile [phone] (signed)     04/27/2009: [facility] -- T-Comprehensive Metabolic Panel [[id number]] (signed)     04/27/2009: [facility] -- T-CBC w/Diff [[id number]] (signed)     04/26/2009: [facility] -- T-PSA Total [[id number]] (signed)     04/26/2009: [facility] -- T-Basic Metabolic Panel [phone] (signed)

## 2010-02-20 NOTE — Progress Notes (Signed)
Summary: REfill/gh  Phone Note Refill Request Message from:  Fax from Pharmacy on September 11, 2009 9:32 AM  Refills Requested: Medication #1:  FLUTICASONE PROPIONATE 50 MCG/ACT SUSP Take 2 sprays in each nostril once daily   Last Refilled: 08/08/2009  Method Requested: Electronic Initial call taken by: Angelina Ok RN,  September 11, 2009 9:32 AM  Follow-up for Phone Call        Refilled electronically.  Follow-up by: Margarito Liner MD,  September 11, 2009 5:26 PM    Prescriptions: FLUTICASONE PROPIONATE 50 MCG/ACT SUSP (FLUTICASONE PROPIONATE) Take 2 sprays in each nostril once daily  #16 Gram x 6   Entered and Authorized by:   Margarito Liner MD   Signed by:   Margarito Liner MD on 09/11/2009   Method used:   Electronically to        [facility] [phone]* (retail)       [address]       [place], [place]  [address]       Ph: [phone]       Fax: [phone]   RxID:   [id number]

## 2010-02-20 NOTE — Progress Notes (Signed)
Summary: refill/ hla  Phone Note Refill Request Message from:  Fax from Pharmacy on September 21, 2009 4:42 PM  Refills Requested: Medication #1:  PROAIR HFA 108 (90 BASE) MCG/ACT  AERS Inhale 2 puffs four times a day as needed   Dosage confirmed as above?Dosage Confirmed   Last Refilled: 12/22/2008 last visit and labs 4/6  Initial call taken by: Marin Roberts RN,  September 21, 2009 4:43 PM  Follow-up for Phone Call        Refilled electronically.  Follow-up by: Margarito Liner MD,  September 21, 2009 6:19 PM    Prescriptions: PROAIR HFA 108 (90 BASE) MCG/ACT  AERS (ALBUTEROL SULFATE) Inhale 2 puffs four times a day as needed  #1 x 5   Entered and Authorized by:   Margarito Liner MD   Signed by:   Margarito Liner MD on 09/21/2009   Method used:   Electronically to        [facility] [phone]* (retail)       [address]       [place], [place]  [address]       Ph: [phone]       Fax: [phone]   RxID:   [id number]

## 2010-02-20 NOTE — Progress Notes (Signed)
Summary: Refill/gh  Phone Note Refill Request Message from:  Fax from Pharmacy on December 21, 2009 2:41 PM  Refills Requested: Medication #1:  METFORMIN HCL 1000 MG TABS Take 1 tablet by mouth two times a day   Last Refilled: 11/22/2009  Method Requested: Electronic Initial call taken by: Angelina Ok RN,  December 21, 2009 2:41 PM  Follow-up for Phone Call        Refilled electronically.  Follow-up by: Margarito Liner MD,  December 21, 2009 4:38 PM    Prescriptions: METFORMIN HCL 1000 MG TABS (METFORMIN HCL) Take 1 tablet by mouth two times a day  #60 x 3   Entered and Authorized by:   Margarito Liner MD   Signed by:   Margarito Liner MD on 12/21/2009   Method used:   Electronically to        [facility] [phone]* (retail)       [address]       [place], [place]  [address]       Ph: [phone]       Fax: [phone]   RxID:   [id number]

## 2010-02-20 NOTE — Progress Notes (Signed)
Summary: refill/gg  Phone Note Refill Request  on April 12, 2009 10:41 AM  Refills Requested: Medication #1:  VALTREX 500 MG TABS Take 1 tablet by mouth once a day   Last Refilled: 03/11/2009  Method Requested: Electronic Initial call taken by: Merrie Roof RN,  April 12, 2009 10:42 AM  Follow-up for Phone Call        Refilled electronically.  Follow-up by: Margarito Liner MD,  April 12, 2009 6:43 PM    Prescriptions: VALTREX 500 MG TABS (VALACYCLOVIR HCL) Take 1 tablet by mouth once a day  #33 x 1   Entered and Authorized by:   Margarito Liner MD   Signed by:   Margarito Liner MD on 04/12/2009   Method used:   Electronically to        [facility] [phone]* (retail)       [address]       [place], [place]  [address]       Ph: [phone]       Fax: [phone]   RxID:   [id number]

## 2010-02-24 ENCOUNTER — Other Ambulatory Visit: Payer: Self-pay | Admitting: *Deleted

## 2010-02-24 NOTE — Telephone Encounter (Signed)
Last refill 08/08/09

## 2010-02-26 ENCOUNTER — Other Ambulatory Visit: Payer: Self-pay | Admitting: Internal Medicine

## 2010-02-26 MED ORDER — METFORMIN HCL 1000 MG PO TABS: 1000.0000 mg | ORAL_TABLET | Freq: Two times a day (BID) | ORAL | Status: DC

## 2010-02-26 MED ORDER — FLUTICASONE PROPIONATE 50 MCG/ACT NA SUSP: 2.0000 | Freq: Every day | NASAL | Status: DC

## 2010-02-26 NOTE — Telephone Encounter (Signed)
Pt already has an appt scheduled for 04-18-10 @1015am 

## 2010-02-26 NOTE — Telephone Encounter (Signed)
Please schedule an appointment with me when available. 

## 2010-03-26 ENCOUNTER — Other Ambulatory Visit: Payer: Self-pay | Admitting: Internal Medicine

## 2010-04-18 ENCOUNTER — Ambulatory Visit (INDEPENDENT_AMBULATORY_CARE_PROVIDER_SITE_OTHER): Payer: Medicaid Other | Admitting: Internal Medicine

## 2010-04-18 ENCOUNTER — Encounter: Payer: Self-pay | Admitting: Internal Medicine

## 2010-04-18 VITALS — BP 132/77 | HR 86 | Temp 97.7°F | Ht 68.0 in | Wt 307.7 lb

## 2010-04-18 DIAGNOSIS — M79605 Pain in left leg: Secondary | ICD-10-CM | POA: Insufficient documentation

## 2010-04-18 DIAGNOSIS — M79609 Pain in unspecified limb: Secondary | ICD-10-CM

## 2010-04-18 DIAGNOSIS — K219 Gastro-esophageal reflux disease without esophagitis: Secondary | ICD-10-CM

## 2010-04-18 DIAGNOSIS — M79604 Pain in right leg: Secondary | ICD-10-CM

## 2010-04-18 DIAGNOSIS — D649 Anemia, unspecified: Secondary | ICD-10-CM

## 2010-04-18 DIAGNOSIS — E119 Type 2 diabetes mellitus without complications: Secondary | ICD-10-CM

## 2010-04-18 DIAGNOSIS — F329 Major depressive disorder, single episode, unspecified: Secondary | ICD-10-CM

## 2010-04-18 DIAGNOSIS — I10 Essential (primary) hypertension: Secondary | ICD-10-CM

## 2010-04-18 DIAGNOSIS — K921 Melena: Secondary | ICD-10-CM

## 2010-04-18 DIAGNOSIS — M545 Low back pain: Secondary | ICD-10-CM

## 2010-04-18 DIAGNOSIS — E785 Hyperlipidemia, unspecified: Secondary | ICD-10-CM

## 2010-04-18 LAB — GLUCOSE, CAPILLARY: Glucose-Capillary: 181 mg/dL — ABNORMAL HIGH (ref 70–99)

## 2010-04-18 LAB — POCT GLYCOSYLATED HEMOGLOBIN (HGB A1C): Hemoglobin A1C: 5.7

## 2010-04-18 NOTE — Assessment & Plan Note (Signed)
Symptoms are well controlled on current dose of Paxil; plan is to continue at current dose.

## 2010-04-18 NOTE — Progress Notes (Signed)
  Subjective:    Patient ID: Carlos Bean, male    DOB: 08/29/1960, 50 y.o.   MRN: [id number]  HPI Patient returns for follow-up of his diabetes mellitus, hypertension, hyperlipidemia, chronic low back pain, and other chronic problems; his last clinic visit was in April of last year.  His main complaint today is chronic low back pain and chronic bilateral knee pain; this is helped by Celebrex, but not completely relieved.  He reports that he has been compliant with his medications.  His blood sugars have been well-controlled on his current dose of metformin.  Regarding his back pain, he reports that he saw a neurosurgeon in [facility] in the past and had an injection which helped; he has not seen his neurosurgeon recently.  His depression Is well controlled on Paxil; he denies any suicidal thoughts.  His reflux symptoms are well controlled on omeprazole.  At the time of his last visit, we scheduled an appointment with [facility] for evaluation of bright red blood per rectum, but patient missed that appointment and did not reschedule; we also scheduled an ophthalmology appointment for diabetic eye exam, and he also missed that appointment and did not reschedule.  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  Patient reports chronic exertional bilateral leg pain which has been somewhat worse recently.  Review of Systems  Respiratory: Negative for shortness of breath.   Cardiovascular: Negative for chest pain and leg swelling.  Gastrointestinal: Negative for nausea, vomiting, abdominal pain and blood in stool.  Genitourinary: Negative for difficulty urinating (No problems so long as he takes Flomax).  Musculoskeletal: Positive for back pain (Chronic). Negative for myalgias.       Objective:   Physical Exam  Constitutional: He appears well-developed. No distress.  Cardiovascular: Normal rate, regular rhythm and normal heart sounds.  Exam reveals no gallop  and no friction rub.   No murmur heard. Pulses:      Dorsalis pedis pulses are 1+ on the right side, and 1+ on the left side.  Pulmonary/Chest: Effort normal and breath sounds normal. No respiratory distress. He has no wheezes. He has no rales.  Abdominal: Soft. Bowel sounds are normal. He exhibits no distension. There is no tenderness. There is no guarding.        Assessment & Plan:

## 2010-04-18 NOTE — Assessment & Plan Note (Signed)
Symptoms are well controlled on on that result; plan is to continue at current dose.

## 2010-04-18 NOTE — Assessment & Plan Note (Signed)
Patient has reasonable relief of his chronic low back pain on Celebrex.  He has had some recent worsening of his symptoms.  I advised him to follow-up with his neurosurgeon in [facility], and he agreed to call for an appointment.

## 2010-04-18 NOTE — Assessment & Plan Note (Signed)
Will check a CBC

## 2010-04-18 NOTE — Assessment & Plan Note (Signed)
Patient has bilateral exertional leg pain and diminished pulses in his feet on physical exam.  This may represent claudication.  The plan is to obtain lower extremity arterial Dopplers/ABI.

## 2010-04-18 NOTE — Assessment & Plan Note (Signed)
Lab Results  Component Value Date   HGBA1C 5.7 04/18/2010   HGBA1C 6.1 04/26/2009   CREATININE 1.16 04/26/2009   MICROALBUR 2.67* 10/19/2008   MICRALBCREAT 15.0 10/19/2008   CHOL 144 02/09/2008   HDL 41 02/09/2008   TRIG 146 02/09/2008    Assessment: Diabetes control: controlled Progress toward goals: at goal Barriers to meeting goals: no barriers identified  Plan: Diabetes treatment: continue current medications Refer to: diabetes educator for self-management training Instruction/counseling given: reminded to get eye exam, reminded to bring blood glucose meter & log to each visit and discussed foot care

## 2010-04-18 NOTE — Assessment & Plan Note (Signed)
Lab Results  Component Value Date   NA 138 04/26/2009   K 4.0 04/26/2009   CL 100 04/26/2009   CO2 25 04/26/2009   BUN 14 04/26/2009   CREATININE 1.16 04/26/2009    BP Readings from Last 3 Encounters:  04/18/10 132/77  04/26/09 137/79  12/27/08 184/100    Assessment: Hypertension control:  controlled  Progress toward goals:  at goal Barriers to meeting goals:  no barriers identified  Plan: Hypertension treatment:  continue current medications

## 2010-04-18 NOTE — Assessment & Plan Note (Signed)
Lipids:    Component Value Date/Time   CHOL 144 02/09/2008 2123   TRIG 146 02/09/2008 2123   HDL 41 02/09/2008 2123   LDLCALC 74 02/09/2008 2123   VLDL 29 02/09/2008 2123   CHOLHDL 3.5 Ratio 02/09/2008 2123   Patient is doing well on current dose of Lipitor.  He will return for a fasting lipid panel.

## 2010-04-18 NOTE — Assessment & Plan Note (Signed)
We scheduled patient for a GI evaluation in April of last year, the patient missed the appointment and did not reschedule.  Plan is to see if we can reschedule this appointment.  I explained to the patient that in the future if he has to miss an appointment, he should cancel and reschedule the appointment.

## 2010-04-18 NOTE — Patient Instructions (Addendum)
Please return for fasting lab work on Friday 04/20/2010. Please schedule a follow up appointment with Carlos Bean for diabetes self-management training. Please return for ABI measurement (doppler ultrasound of arteries).

## 2010-04-20 ENCOUNTER — Other Ambulatory Visit: Payer: Medicaid Other

## 2010-04-20 DIAGNOSIS — D649 Anemia, unspecified: Secondary | ICD-10-CM

## 2010-04-20 DIAGNOSIS — F329 Major depressive disorder, single episode, unspecified: Secondary | ICD-10-CM

## 2010-04-20 DIAGNOSIS — E785 Hyperlipidemia, unspecified: Secondary | ICD-10-CM

## 2010-04-20 DIAGNOSIS — E119 Type 2 diabetes mellitus without complications: Secondary | ICD-10-CM

## 2010-04-20 LAB — COMPREHENSIVE METABOLIC PANEL
AST: 18 U/L (ref 0–37)
Alkaline Phosphatase: 51 U/L (ref 39–117)
BUN: 14 mg/dL (ref 6–23)
Glucose, Bld: 92 mg/dL (ref 70–99)
Sodium: 141 mEq/L (ref 135–145)
Total Bilirubin: 0.4 mg/dL (ref 0.3–1.2)

## 2010-04-20 LAB — CBC WITH DIFFERENTIAL/PLATELET
Basophils Absolute: 0 10*3/uL (ref 0.0–0.1)
Basophils Relative: 0 % (ref 0–1)
MCHC: 31.8 g/dL (ref 30.0–36.0)
Monocytes Absolute: 0.5 10*3/uL (ref 0.1–1.0)
Neutro Abs: 2.9 10*3/uL (ref 1.7–7.7)
Neutrophils Relative %: 47 % (ref 43–77)
Platelets: 326 10*3/uL (ref 150–400)
RDW: 13.4 % (ref 11.5–15.5)

## 2010-04-20 LAB — LIPID PANEL
HDL: 50 mg/dL (ref >39)
LDL Cholesterol: 66 mg/dL (ref 0–99)
Triglycerides: 85 mg/dL (ref <150)
VLDL: 17 mg/dL (ref 0–40)

## 2010-04-20 LAB — TSH: TSH: 0.606 u[IU]/mL (ref 0.350–4.500)

## 2010-04-24 LAB — CBC
HCT: 37.8 % — ABNORMAL LOW (ref 39.0–52.0)
Hemoglobin: 12.6 g/dL — ABNORMAL LOW (ref 13.0–17.0)
MCV: 91.1 fL (ref 78.0–100.0)
Platelets: 310 10*3/uL (ref 150–400)
RBC: 4.14 MIL/uL — ABNORMAL LOW (ref 4.22–5.81)
WBC: 8.8 10*3/uL (ref 4.0–10.5)

## 2010-04-24 LAB — DIFFERENTIAL
Eosinophils Absolute: 0.2 10*3/uL (ref 0.0–0.7)
Eosinophils Relative: 2 % (ref 0–5)
Lymphs Abs: 2.9 10*3/uL (ref 0.7–4.0)
Monocytes Absolute: 0.6 10*3/uL (ref 0.1–1.0)
Monocytes Relative: 7 % (ref 3–12)

## 2010-04-24 LAB — BASIC METABOLIC PANEL
BUN: 12 mg/dL (ref 6–23)
Chloride: 103 mEq/L (ref 96–112)
Creatinine, Ser: 1 mg/dL (ref 0.4–1.5)

## 2010-04-24 LAB — GLUCOSE, CAPILLARY: Glucose-Capillary: 94 mg/dL (ref 70–99)

## 2010-04-26 ENCOUNTER — Other Ambulatory Visit: Payer: Self-pay | Admitting: Internal Medicine

## 2010-04-27 LAB — RAPID STREP SCREEN (MED CTR MEBANE ONLY): Streptococcus, Group A Screen (Direct): NEGATIVE

## 2010-04-27 LAB — GLUCOSE, CAPILLARY
Glucose-Capillary: 159 mg/dL — ABNORMAL HIGH (ref 70–99)
Glucose-Capillary: 176 mg/dL — ABNORMAL HIGH (ref 70–99)
Glucose-Capillary: 188 mg/dL — ABNORMAL HIGH (ref 70–99)
Glucose-Capillary: 215 mg/dL — ABNORMAL HIGH (ref 70–99)
Glucose-Capillary: 251 mg/dL — ABNORMAL HIGH (ref 70–99)
Glucose-Capillary: 271 mg/dL — ABNORMAL HIGH (ref 70–99)
Glucose-Capillary: 94 mg/dL (ref 70–99)

## 2010-04-27 LAB — DIFFERENTIAL
Basophils Relative: 1 % (ref 0–1)
Eosinophils Absolute: 0 10*3/uL (ref 0.0–0.7)
Eosinophils Absolute: 0.1 10*3/uL (ref 0.0–0.7)
Eosinophils Relative: 0 % (ref 0–5)
Eosinophils Relative: 0 % (ref 0–5)
Lymphocytes Relative: 4 % — ABNORMAL LOW (ref 12–46)
Lymphs Abs: 0.9 10*3/uL (ref 0.7–4.0)
Lymphs Abs: 1.4 10*3/uL (ref 0.7–4.0)
Monocytes Absolute: 0.4 10*3/uL (ref 0.1–1.0)
Neutrophils Relative %: 79 % — ABNORMAL HIGH (ref 43–77)

## 2010-04-27 LAB — COMPREHENSIVE METABOLIC PANEL
ALT: 20 U/L (ref 0–53)
AST: 19 U/L (ref 0–37)
Albumin: 3.4 g/dL — ABNORMAL LOW (ref 3.5–5.2)
CO2: 25 mEq/L (ref 19–32)
Calcium: 9.1 mg/dL (ref 8.4–10.5)
Chloride: 98 mEq/L (ref 96–112)
GFR calc Af Amer: >60 mL/min (ref >60)
GFR calc non Af Amer: >60 mL/min (ref >60)
Sodium: 134 mEq/L — ABNORMAL LOW (ref 135–145)
Total Bilirubin: 0.4 mg/dL (ref 0.3–1.2)

## 2010-04-27 LAB — BASIC METABOLIC PANEL
Chloride: 99 mEq/L (ref 96–112)
GFR calc non Af Amer: >60 mL/min (ref >60)
Glucose, Bld: 118 mg/dL — ABNORMAL HIGH (ref 70–99)
Potassium: 4.2 mEq/L (ref 3.5–5.1)
Sodium: 138 mEq/L (ref 135–145)

## 2010-04-27 LAB — STREP A DNA PROBE

## 2010-04-27 LAB — CBC
HCT: 37.7 % — ABNORMAL LOW (ref 39.0–52.0)
Hemoglobin: 12.4 g/dL — ABNORMAL LOW (ref 13.0–17.0)
MCHC: 33.4 g/dL (ref 30.0–36.0)
MCV: 90.1 fL (ref 78.0–100.0)
Platelets: 291 10*3/uL (ref 150–400)
Platelets: 271 10*3/uL (ref 150–400)
RBC: 4.13 MIL/uL — ABNORMAL LOW (ref 4.22–5.81)
RBC: 4.17 MIL/uL — ABNORMAL LOW (ref 4.22–5.81)
WBC: 16.5 10*3/uL — ABNORMAL HIGH (ref 4.0–10.5)
WBC: 19.7 10*3/uL — ABNORMAL HIGH (ref 4.0–10.5)
WBC: 16.1 10*3/uL — ABNORMAL HIGH (ref 4.0–10.5)

## 2010-04-27 LAB — PROTIME-INR: Prothrombin Time: 13.5 seconds (ref 11.6–15.2)

## 2010-04-27 LAB — CULTURE, BLOOD (ROUTINE X 2): Culture: NO GROWTH

## 2010-04-27 LAB — GC/CHLAMYDIA PROBE AMP, URINE: GC Probe Amp, Urine: NEGATIVE

## 2010-04-27 LAB — HIV ANTIBODY (ROUTINE TESTING W REFLEX): HIV: NONREACTIVE

## 2010-04-30 ENCOUNTER — Other Ambulatory Visit: Payer: Self-pay | Admitting: *Deleted

## 2010-04-30 DIAGNOSIS — E119 Type 2 diabetes mellitus without complications: Secondary | ICD-10-CM

## 2010-04-30 LAB — GLUCOSE, CAPILLARY: Glucose-Capillary: 98 mg/dL (ref 70–99)

## 2010-04-30 NOTE — Telephone Encounter (Signed)
Receive call from [facility] in [place], due to [place], Prodigy test strips/supplies need to be change to Accu Chek Aviva or Accu Chek Compact Plus.  Thanks

## 2010-05-01 LAB — HERPES SIMPLEX VIRUS CULTURE: Culture: DETECTED

## 2010-05-01 LAB — GC/CHLAMYDIA PROBE AMP, GENITAL
Chlamydia, DNA Probe: NEGATIVE
GC Probe Amp, Genital: NEGATIVE

## 2010-05-01 NOTE — Telephone Encounter (Signed)
Please find out which one the patient will be using.

## 2010-05-02 NOTE — Telephone Encounter (Signed)
Pt has been called/messages (x2) left to clarify which meter he wants/have purchased.

## 2010-05-09 ENCOUNTER — Ambulatory Visit: Payer: Medicaid Other | Admitting: Dietician

## 2010-05-09 MED ORDER — GLUCOSE BLOOD VI STRP: ORAL_STRIP | Status: DC

## 2010-05-09 MED ORDER — ACCU-CHEK SOFTCLIX LANCET DEV MISC: Status: DC

## 2010-05-09 MED ORDER — ACCU-CHEK COMPACT PLUS CARE KIT: PACK | Status: DC

## 2010-05-09 NOTE — Telephone Encounter (Signed)
Refills approved.

## 2010-05-09 NOTE — Telephone Encounter (Signed)
Pt request Accu chek compact plus.

## 2010-06-03 ENCOUNTER — Other Ambulatory Visit: Payer: Self-pay | Admitting: Internal Medicine

## 2010-06-08 NOTE — Op Note (Signed)
   NAMEGARRUS, GAUTHREAUX                         ACCOUNT NO.:  [id number]   MEDICAL RECORD NO.:  [id number]                   PATIENT TYPE:  AMB   LOCATION:  DSC                                  FACILITY:  [facility]   PHYSICIAN:  Sherrick A. Chaney Malling, M.D.           DATE OF BIRTH:  August 15, 1960   DATE OF PROCEDURE:  02/25/2002  DATE OF DISCHARGE:                                 OPERATIVE REPORT   PREOPERATIVE DIAGNOSIS:  Right carpal tunnel.   POSTOPERATIVE DIAGNOSIS:  Right carpal tunnel.   OPERATION:  Release transverse carpal ligament right wrist and decompression  median nerve right wrist.   SURGEON:  Laine A. Chaney Malling, M.D.   ANESTHESIA:  MAC.   DESCRIPTION OF PROCEDURE:  The patient was placed on the operating table in  the supine position with a pneumatic tourniquet about the right upper arm.  Her extremity was prepped and draped in the usual manner.  The area of the  incision was infiltrated with 1% local Xylocaine.  The arm and hand was then  wrapped with an Esmarch and the tourniquet was elevated.  A ______ incision  was started in the mid palmar space and carried proximally to the volar  wrist crease.  Skin edges were retracted.  The bleeders were coagulated.  Loop magnification was used throughout.  The fascia in the forearm was  opened and the median nerve was identified and isolated.  The curved Mayo  scissors placed above the median nerve but below the transverse carpal  ligament.  Dissection was carried down directly onto the transverse carpal  ligament and the transverse carpal ligament was released starting to the mid  palmar space along the ulnar border of the carpal canal.  Excellent  decompression of the nerve was achieved.  There was marked flattening and  loss of vascular markings underneath the transverse carpal ligament.  No  other space occupying lesions were seen.  The wound was then bathed in  Marcaine and the skin edges were closed with interrupted 0  nylon sutures.  Sterile dressings were applied and the patient returned to the recovery room  in excellent condition.   DRAINS:  None.   COMPLICATIONS:  None.                                               Raijon A. Chaney Malling, M.D.    RAM/MEDQ  D:  02/25/2002  T:  02/25/2002  Job:  [id number]

## 2010-06-08 NOTE — Discharge Summary (Signed)
[place]  Patient:    Carlos Bean, Carlos Bean                    MRN: [id number] Adm. Date:  [id number] Disc. Date: [id number] Attending:  Madaline Guthrie Dictator:   Dominica Severin, MS4 CC:         Madaline Guthrie, M.D.  Outpatient Clinic   Discharge Summary  DISCHARGE DIAGNOSES: 1. Atypical chest pain, status post negative electrocardiogram and negative    cardiac enzymes. 2. Gastroesophageal reflux disease. 3. Bilateral carpal tunnel syndrome. 4. Chronic low back pain secondary to crush injury in 1994. 5. History of urinary frequency and urgency. 6. History of chronic bronchitis. 7. Depression. 8. History of gonorrhea.  DISCHARGE MEDICATIONS: 1. Protonix 40 mg one p.o. q.d. 2. Paxil 20 mg one p.o. q.d. 3. Vioxx 25 mg one p.o. q.d. 4. Flomax 0.4 mg one p.o. q.d. 5. Aspirin 81 mg one p.o. q.d. 6. Tylenol 325 mg two tablets p.o. q.6h. as needed for pain. 7. Dulcolax 10 mg one tablet p.o. p.r.n. constipation.  FOLLOW-UP:  Appointment to be arranged with Dominica Severin, MS4 in the Outpatient Clinic at [facility].  Treadmill stress EKG also to be arranged on follow-up at [facility].  PROCEDURE: 1. Chest x-ray, April 25, 2000, showed normal heart size, mediastinal contours,    and vascularity.  Peribronchial thickening possibly consistent with chronic    bronchitis. 2. EKG on admission showed normal sinus rhythm.  HISTORY OF PRESENT ILLNESS:  This is a 50 year old African-American male who presents to emergency department complaining of "mild pain" in the center of his chest that has worsened over the past three days.  He says that he has experienced this pain over the last one to two years, but over the past three days, this has become more of a stabbing, squeezing pain that lasts 10 to 20 minutes at a time.  He experiences this pain daily.  It radiates down his left arm and leg, both of which become numb  and tingling.  He does complain of shortness of breath with this pain, but the chest pain is not related to activity or exertion.  He says it is related to anxiety.  He describes himself as occasionally being nauseated with the pain and occasionally having diaphoresis with the pain.  He always experiences dry mouth.  He denies palpitations, paroxysmal nocturnal dyspnea, and edema.  Of note, this patient suffered from a work-related back injury in 1994 from which he is disabled - during the entirety of the interview, the patient digressed from discussing his chest pain to talking about his lower back pain, the pain shooting down his left leg from his back, the pain on the top of his head, and the fact that he is concerned he might be having a stroke like his father did in his early 8s.  The patients cardiac risk factors include a family history of cardiac disease in the young (his father had a CVA at the age of 63 and had hypertension), and his male gender.  The patient does not have a personal history of diabetes, though, he does have a family history of diabetes.  He does not have a significant history of smoking, his lipid risk factor is unknown.  ALLERGIES:  BIAXIN and STEROIDS.  MEDICATIONS: 1. Paxil. 2. Vioxx. 3. Tylenol. 4. Baby aspirin. 5. Pepcid. 6. Flomax.  FAMILY HISTORY:  Father CVA at age 77, had hypertension, died  of CHF at 78. Mother died of diabetes mellitus with end-stage renal disease at age 41.  He has one sister with diabetes mellitus, another sister with hypertension.  SOCIAL HISTORY:  He has a remote history of heavy drinking.  He does not have a smoking history or history of illicit drugs.  REVIEW OF SYSTEMS:  Positive for headaches on the crown of the patients head for approximately one month without dizziness, presyncope, blurring or photophobia.  He also complains of reflux and constipation.  His recent psychiatric problems include depression and  anxiety.  PHYSICAL EXAMINATION:  GENERAL: This is an obese, pleasant, African-American man lying in bed with no apparent distress.  VITAL SIGNS: Temperature 98.3, pulse 86, respiratory rate 24, blood pressure 168/87.  HEENT: Normocephalic and atraumatic.  Pupils are equally round and reactive to light and accommodation.  Extraocular movements intact.  Discs are sharp bilaterally without AV nicking or flame hemorrhages.  Oropharynx is clear.  NECK: Supple with no masses or thyromegaly.  No JVD or bruits.  LUNGS: Clear to auscultation bilaterally.  HEART: Regular rate and rhythm without murmurs, rubs, or gallops.  Pulses are palpable x 4.  ABDOMEN: Obese, soft, positive mild tenderness in the right and left lower quadrants.  No rebound or guarding. No masses or hepatosplenomegaly.  EXTREMITIES: No clubbing, cyanosis, or edema.  NEUROLOGICAL: There is decreased pinprick sensation in the left upper and lower extremities.  Strength is 5/5 right and left upper extremities and the right lower extremity.  Left lower extremity cannot be assessed secondary to pain.  Cranial nerves II-XII intact.  The patient is alert and oriented x 4.  LABORATORY DATA:  White count 6.9, hemoglobin 12.6, hematocrit 37.3, platelets 278.  PT 12.4, INR 0.9, PTT 30.  Sodium 135, potassium 3.8, chloride 101, bicarb 27, BUN 12, creatinine 1.0, glucose 102.  Total bilirubin 0.7, alkaline phosphatase 50, SGOT 28, SGPT 36, total protein 7.1, albumin 3.7, calcium 9.3, CK 268, CK-MB 1.4, troponin I less than 0.1.  HOSPITAL COURSE:  #1 - Cardiology.  The patient was placed on telemetry.  His cardiac enzymes were monitored.  The results of his cardiac enzymes were as follows.  April 5, at 1948 CK 268, CK-MB 1.4, troponin I at less than 0.1.  April 6, at 0445, CK total 223, CK-MB 1.1, troponin I 0.01.  April 6, at 1245, CK 235, CK-MB 0.5,  troponin I 0.01.  These values were felt to be not consistent with ischemia. The  mildly elevated CK was thought to be secondary to the patients body habitus.  The patients telemetry showed normal sinus rhythm throughout the hospital course. It was decided given that the patients father had had a cerebrovascular event at a young age, that we would bring the patient in for a treadmill test on follow-up.  #2 - Endocrine.  Although the patients random glucose on admission was 102, given the patients family history of diabetes, an attempt was made to quantify his risk factors for cardiac disease and hemoglobin A1C was tested, this was 5.2.  #3 - GI.  The patient has a history of reflux symptoms and has been on Pepcid in the past.  The patient describes his current chest pain as being distinctively different than his reflux symptoms, however, esophageal spasm associated with reflux can mimick MI.  Therefore, the patient was switched to Protonix and discharged on this medicine.  In addition, the patient was placed on Dulcolax p.r.n. for his constipation.  #4 - Musculoskeletal.  The patient was continued on Vioxx and Tylenol for his chronic back pain.  #5 - Psychiatric.  The patient was continued on Paxil for his depression.  #6 - The patient was continued on Flomax for his urinary frequency and urgency.  DISCHARGE LABORATORY DATA:  None. DD:  04/28/00 TD:  04/28/00 Job: [id number]

## 2010-06-20 ENCOUNTER — Ambulatory Visit: Payer: Medicaid Other | Admitting: Internal Medicine

## 2010-07-06 ENCOUNTER — Other Ambulatory Visit: Payer: Self-pay | Admitting: Internal Medicine

## 2010-07-06 DIAGNOSIS — K219 Gastro-esophageal reflux disease without esophagitis: Secondary | ICD-10-CM

## 2010-07-06 DIAGNOSIS — J309 Allergic rhinitis, unspecified: Secondary | ICD-10-CM

## 2010-07-06 DIAGNOSIS — M545 Low back pain: Secondary | ICD-10-CM

## 2010-07-06 DIAGNOSIS — I10 Essential (primary) hypertension: Secondary | ICD-10-CM

## 2010-07-06 DIAGNOSIS — F329 Major depressive disorder, single episode, unspecified: Secondary | ICD-10-CM

## 2010-07-09 ENCOUNTER — Other Ambulatory Visit: Payer: Self-pay | Admitting: Dietician

## 2010-07-09 ENCOUNTER — Telehealth: Payer: Self-pay | Admitting: *Deleted

## 2010-07-09 DIAGNOSIS — O24919 Unspecified diabetes mellitus in pregnancy, unspecified trimester: Secondary | ICD-10-CM

## 2010-07-09 NOTE — Telephone Encounter (Signed)
Contacted Medicare initiate PA for pt's atorvastatin 10mg . Because pt has a family hx of hyperlipidemia on both sides, the PA was approved from 07/09/10 to 07/09/11.  Both pt and pharmacy made aware.

## 2010-07-12 ENCOUNTER — Ambulatory Visit: Payer: Medicaid Other | Admitting: Dietician

## 2010-08-06 ENCOUNTER — Encounter: Payer: Self-pay | Admitting: Internal Medicine

## 2010-10-22 LAB — URINALYSIS, ROUTINE W REFLEX MICROSCOPIC
Bilirubin Urine: NEGATIVE
Ketones, ur: NEGATIVE
Leukocytes, UA: NEGATIVE
Nitrite: NEGATIVE
Protein, ur: NEGATIVE
Urobilinogen, UA: 0.2

## 2010-10-22 LAB — URINE CULTURE: Colony Count: 50000

## 2010-10-22 LAB — GLUCOSE, CAPILLARY
Glucose-Capillary: 310 — ABNORMAL HIGH
Glucose-Capillary: 559

## 2010-10-22 LAB — BASIC METABOLIC PANEL WITH GFR
CO2: 26
Calcium: 9.9
Chloride: 94 — ABNORMAL LOW
GFR calc Af Amer: >60
Potassium: 5.3 — ABNORMAL HIGH
Sodium: 133 — ABNORMAL LOW

## 2010-10-22 LAB — BASIC METABOLIC PANEL
BUN: 18
Creatinine, Ser: 1
GFR calc non Af Amer: >60
Glucose, Bld: 513

## 2010-10-22 LAB — URINE MICROSCOPIC-ADD ON

## 2010-10-23 LAB — GLUCOSE, CAPILLARY: Glucose-Capillary: 105 — ABNORMAL HIGH

## 2010-11-30 ENCOUNTER — Telehealth: Payer: Self-pay | Admitting: *Deleted

## 2010-11-30 NOTE — Telephone Encounter (Signed)
Called [phone] - Celebrex is pending till paperwork is completed. Stanton Kidney Gerald Honea RN 11/29/10 10:30AM.

## 2011-01-21 ENCOUNTER — Other Ambulatory Visit: Payer: Self-pay | Admitting: Internal Medicine

## 2011-01-23 ENCOUNTER — Other Ambulatory Visit: Payer: Self-pay | Admitting: *Deleted

## 2011-01-23 MED ORDER — TAMSULOSIN HCL 0.4 MG PO CAPS: 0.4000 mg | ORAL_CAPSULE | Freq: Every day | ORAL | Status: DC

## 2011-03-13 ENCOUNTER — Encounter: Payer: Self-pay | Admitting: Internal Medicine

## 2011-03-13 ENCOUNTER — Ambulatory Visit (INDEPENDENT_AMBULATORY_CARE_PROVIDER_SITE_OTHER): Payer: Medicaid Other | Admitting: Internal Medicine

## 2011-03-13 VITALS — BP 155/93 | HR 81 | Temp 97.8°F | Ht 68.0 in | Wt 321.1 lb

## 2011-03-13 DIAGNOSIS — N400 Enlarged prostate without lower urinary tract symptoms: Secondary | ICD-10-CM | POA: Insufficient documentation

## 2011-03-13 DIAGNOSIS — Z23 Encounter for immunization: Secondary | ICD-10-CM

## 2011-03-13 DIAGNOSIS — F329 Major depressive disorder, single episode, unspecified: Secondary | ICD-10-CM

## 2011-03-13 DIAGNOSIS — Z79899 Other long term (current) drug therapy: Secondary | ICD-10-CM

## 2011-03-13 DIAGNOSIS — E785 Hyperlipidemia, unspecified: Secondary | ICD-10-CM

## 2011-03-13 DIAGNOSIS — G8929 Other chronic pain: Secondary | ICD-10-CM

## 2011-03-13 DIAGNOSIS — E119 Type 2 diabetes mellitus without complications: Secondary | ICD-10-CM

## 2011-03-13 DIAGNOSIS — M545 Low back pain: Secondary | ICD-10-CM

## 2011-03-13 DIAGNOSIS — K219 Gastro-esophageal reflux disease without esophagitis: Secondary | ICD-10-CM

## 2011-03-13 DIAGNOSIS — I10 Essential (primary) hypertension: Secondary | ICD-10-CM

## 2011-03-13 LAB — CBC WITH DIFFERENTIAL/PLATELET
Eosinophils Absolute: 0.2 10*3/uL (ref 0.0–0.7)
Eosinophils Relative: 3 % (ref 0–5)
HCT: 39.2 % (ref 39.0–52.0)
Lymphs Abs: 3.2 10*3/uL (ref 0.7–4.0)
Monocytes Relative: 8 % (ref 3–12)
Neutrophils Relative %: 48 % (ref 43–77)
WBC: 7.8 10*3/uL (ref 4.0–10.5)

## 2011-03-13 LAB — LIPID PANEL
HDL: 54 mg/dL (ref >39)
LDL Cholesterol: 82 mg/dL (ref 0–99)
Total CHOL/HDL Ratio: 3.1 Ratio
Triglycerides: 145 mg/dL (ref <150)
VLDL: 29 mg/dL (ref 0–40)

## 2011-03-13 LAB — GLUCOSE, CAPILLARY: Glucose-Capillary: 108 mg/dL — ABNORMAL HIGH (ref 70–99)

## 2011-03-13 LAB — POCT GLYCOSYLATED HEMOGLOBIN (HGB A1C): Hemoglobin A1C: 6

## 2011-03-13 MED ORDER — ENALAPRIL MALEATE 2.5 MG PO TABS: 2.5000 mg | ORAL_TABLET | Freq: Every day | ORAL | Status: DC

## 2011-03-13 MED ORDER — IBUPROFEN 400 MG PO TABS: 400.0000 mg | ORAL_TABLET | Freq: Three times a day (TID) | ORAL | Status: DC | PRN

## 2011-03-13 MED ORDER — TOLNAFTATE 1 % EX POWD: Freq: Two times a day (BID) | CUTANEOUS | Status: AC

## 2011-03-13 NOTE — Assessment & Plan Note (Addendum)
Lipids:    Component Value Date/Time   CHOL 133 04/20/2010 1137   TRIG 85 04/20/2010 1137   HDL 50 04/20/2010 1137   LDLCALC 66 04/20/2010 1137   VLDL 17 04/20/2010 1137   CHOLHDL 2.7 04/20/2010 1137    Assessment: Patient is doing well on current dose of atorvastatin without apparent side effects.  Plan: Check labs including a metabolic panel and lipid panel today; continue atorvastatin 10 mg daily pending these results.

## 2011-03-13 NOTE — Assessment & Plan Note (Signed)
Assessment: Patient's symptoms are well controlled on omeprazole 20 mg twice a day.  Plan: Continue omeprazole at current dose

## 2011-03-13 NOTE — Assessment & Plan Note (Signed)
Lab Results  Component Value Date   NA 141 04/20/2010   K 4.3 04/20/2010   CL 103 04/20/2010   CO2 24 04/20/2010   BUN 14 04/20/2010   CREATININE 1.01 04/20/2010   CREATININE 1.16 04/26/2009    BP Readings from Last 3 Encounters:  03/13/11 155/93  04/18/10 132/77  04/26/09 137/79    Assessment: Hypertension control:  moderately elevated  Progress toward goals:  deteriorated Barriers to meeting goals:  nonadherence to medications  Plan: Hypertension treatment:  Patient has been out of enalapril for about a month; I advised him to have it refilled and resume at previous dose.

## 2011-03-13 NOTE — Assessment & Plan Note (Signed)
Assessment: Patient has a history of BPH, and has been seen in the past by Dr. Vernie Ammons.  He is taking Flomax regularly, but in recent months has noted more problems with hesitancy and a feeling of incomplete emptying of his bladder.  A bladder scan for postvoid residual today in clinic showed a PVR of 69 mL.  Plan: Continue Flomax; refer to [facility] for reassessment.

## 2011-03-13 NOTE — Assessment & Plan Note (Signed)
Assessment: Patient has chronic low back pain, and was managed for years on Celebrex with reasonably good control.  Medicaid will no longer pay for the Celebrex, and his pain is not well controlled.  Plan: Treat with ibuprofen 400 mg every 8 hours as needed for pain; I advised him to use this only when needed, and discussed potential adverse effects of NSAID medications with him.

## 2011-03-13 NOTE — Patient Instructions (Addendum)
Please refill enalapril 2.5 mg and take 1 tablet daily  Start ibuprofen 400 mg one tablet every 8 hours if needed for pain; take with food. Apply tolnaftate (Tinactin) 1% powder twice a day to the affected areas of your feet for 2-4 weeks. A referral has been made to urology.

## 2011-03-13 NOTE — Assessment & Plan Note (Addendum)
Hemoglobin A1C  Date Value Range Status  03/13/2011 6.0   Final     Assessment: Diabetes control: controlled Progress toward goals: at goal Barriers to meeting goals: no barriers identified  Plan: Diabetes treatment: continue current medications Refer to: none Instruction/counseling given: reminded to get eye exam and discussed foot care

## 2011-03-13 NOTE — Assessment & Plan Note (Signed)
Assessment: Patient is doing well on current dose of Paroxetine.  Plan: Continue Paroxetine 20 mg daily.

## 2011-03-13 NOTE — Progress Notes (Signed)
  Subjective:    Patient ID: Carlos Bean, male    DOB: 30-May-1960, 51 y.o.   MRN: [id number]  HPI Patient returns for followup of his diabetes mellitus, hypertension, hyperlipidemia, chronic low back pain, and other medical problems.  He reports poor control of his back pain since he has been off of Celebrex; refill of that medication was denied by Medicaid.  His blood sugars have been doing well, with an average value of 140 over the past month, and about 78% of measurements within target range.  Patient reports some discomfort of his feet last month which is now improved.  He reports some worsening of urinary hesitancy and a feeling of incomplete emptying of his bladder.  He has been out of his enalapril for about one month; otherwise he reports that he has been compliant with his medications.   Review of Systems  Constitutional: Negative for fever, chills and diaphoresis.  Respiratory: Negative for shortness of breath.   Cardiovascular: Negative for chest pain and leg swelling.  Gastrointestinal: Negative for nausea, vomiting, abdominal pain and blood in stool.  Genitourinary: Positive for difficulty urinating (Some hesitancy, feeling of not emptying bladder.). Negative for dysuria.  Musculoskeletal: Positive for back pain (Chronic).  Psychiatric/Behavioral: Negative for suicidal ideas and dysphoric mood.       Objective:   Physical Exam  Constitutional: No distress.  Cardiovascular: Normal rate, regular rhythm and normal heart sounds.  Exam reveals no gallop and no friction rub.   No murmur heard. Pulmonary/Chest: Effort normal and breath sounds normal. No respiratory distress. He has no wheezes. He has no rales.  Abdominal: Soft. Bowel sounds are normal. He exhibits no distension. There is no tenderness. There is no rebound and no guarding.  Musculoskeletal: He exhibits no edema.  Skin: Rash (Areas of skin thickening and scale on the soles of feet consistent with tinea pedis)  noted.    Post void residual by bladder scan done in clinic today was 69 mL.     Assessment & Plan:

## 2011-03-14 LAB — COMPLETE METABOLIC PANEL WITH GFR
ALT: 21 U/L (ref 0–53)
AST: 17 U/L (ref 0–37)
Alkaline Phosphatase: 58 U/L (ref 39–117)
CO2: 27 mEq/L (ref 19–32)
Creat: 1.01 mg/dL (ref 0.50–1.35)
Sodium: 140 mEq/L (ref 135–145)
Total Bilirubin: 0.3 mg/dL (ref 0.3–1.2)
Total Protein: 7.1 g/dL (ref 6.0–8.3)

## 2011-03-14 LAB — URINALYSIS, ROUTINE W REFLEX MICROSCOPIC
Glucose, UA: NEGATIVE mg/dL
Hgb urine dipstick: NEGATIVE
Leukocytes, UA: NEGATIVE
pH: 5.5 (ref 5.0–8.0)

## 2011-03-14 LAB — MICROALBUMIN / CREATININE URINE RATIO: Microalb Creat Ratio: 19.8 mg/g (ref 0.0–30.0)

## 2011-04-09 ENCOUNTER — Other Ambulatory Visit: Payer: Self-pay | Admitting: Internal Medicine

## 2011-05-22 NOTE — Progress Notes (Signed)
Addended by: Neomia Dear on: 05/22/2011 05:50 PM   Modules accepted: Orders

## 2011-05-23 ENCOUNTER — Other Ambulatory Visit: Payer: Self-pay | Admitting: Internal Medicine

## 2011-06-22 DIAGNOSIS — J329 Chronic sinusitis, unspecified: Secondary | ICD-10-CM

## 2011-06-22 HISTORY — DX: Chronic sinusitis, unspecified: J32.9

## 2011-06-27 ENCOUNTER — Other Ambulatory Visit: Payer: Self-pay | Admitting: Internal Medicine

## 2011-06-30 ENCOUNTER — Other Ambulatory Visit: Payer: Self-pay | Admitting: Internal Medicine

## 2011-07-23 ENCOUNTER — Emergency Department (HOSPITAL_BASED_OUTPATIENT_CLINIC_OR_DEPARTMENT_OTHER): Payer: Medicaid Other

## 2011-07-23 ENCOUNTER — Emergency Department (HOSPITAL_BASED_OUTPATIENT_CLINIC_OR_DEPARTMENT_OTHER)
Admission: EM | Admit: 2011-07-23 | Discharge: 2011-07-23 | Disposition: A | Discharge status: Home / Self Care | Payer: Medicaid Other | Attending: Emergency Medicine | Admitting: Emergency Medicine

## 2011-07-23 ENCOUNTER — Encounter (HOSPITAL_BASED_OUTPATIENT_CLINIC_OR_DEPARTMENT_OTHER): Payer: Self-pay | Admitting: *Deleted

## 2011-07-23 DIAGNOSIS — Z7982 Long term (current) use of aspirin: Secondary | ICD-10-CM | POA: Insufficient documentation

## 2011-07-23 DIAGNOSIS — E785 Hyperlipidemia, unspecified: Secondary | ICD-10-CM | POA: Insufficient documentation

## 2011-07-23 DIAGNOSIS — R51 Headache: Secondary | ICD-10-CM | POA: Insufficient documentation

## 2011-07-23 DIAGNOSIS — Z79899 Other long term (current) drug therapy: Secondary | ICD-10-CM | POA: Insufficient documentation

## 2011-07-23 DIAGNOSIS — F329 Major depressive disorder, single episode, unspecified: Secondary | ICD-10-CM | POA: Insufficient documentation

## 2011-07-23 DIAGNOSIS — E119 Type 2 diabetes mellitus without complications: Secondary | ICD-10-CM | POA: Insufficient documentation

## 2011-07-23 DIAGNOSIS — M545 Low back pain, unspecified: Secondary | ICD-10-CM | POA: Insufficient documentation

## 2011-07-23 DIAGNOSIS — I10 Essential (primary) hypertension: Secondary | ICD-10-CM | POA: Insufficient documentation

## 2011-07-23 DIAGNOSIS — G8929 Other chronic pain: Secondary | ICD-10-CM | POA: Insufficient documentation

## 2011-07-23 DIAGNOSIS — K219 Gastro-esophageal reflux disease without esophagitis: Secondary | ICD-10-CM | POA: Insufficient documentation

## 2011-07-23 DIAGNOSIS — F3289 Other specified depressive episodes: Secondary | ICD-10-CM | POA: Insufficient documentation

## 2011-07-23 HISTORY — DX: Bronchitis, not specified as acute or chronic: J40

## 2011-07-23 MED ORDER — HYDROCODONE-ACETAMINOPHEN 5-500 MG PO TABS
1.0000 | ORAL_TABLET | Freq: Four times a day (QID) | ORAL | Status: AC | PRN
Start: 2011-07-23 — End: 2011-08-02

## 2011-07-23 MED ORDER — AMOXICILLIN 500 MG PO CAPS: 500.0000 mg | ORAL_CAPSULE | Freq: Three times a day (TID) | ORAL | Status: AC

## 2011-07-23 NOTE — ED Notes (Signed)
Patient states he has had sharp pains in the left side of his head for the last 24 hours which has been associated with slight dizziness and sweating yesterday.  States for the last 2 weeks, he has had left arm pain and "catchintg" when he lifts his left arm.

## 2011-07-23 NOTE — ED Provider Notes (Signed)
History     CSN: [id number]  Arrival date & time 07/23/11  1100   First MD Initiated Contact with Patient 07/23/11 1122      Chief Complaint  Patient presents with  . Headache    (Consider location/radiation/quality/duration/timing/severity/associated sxs/prior treatment) Patient is a 51 y.o. male presenting with headaches. The history is provided by the patient.  Headache  This is a new problem. The current episode started 2 days ago. The problem occurs constantly. The problem has been gradually worsening. The headache is associated with nothing. Pain location: top of head just to the left. The pain is moderate. The pain does not radiate. Pertinent negatives include no fever, no palpitations, no nausea and no vomiting. He has tried aspirin for the symptoms. The treatment provided mild relief.    Past Medical History  Diagnosis Date  . Diabetes mellitus, type 2   . Hypertension   . Hyperlipemia   . GERD (gastroesophageal reflux disease)   . Depression   . Chronic low back pain   . Obesity   . BPH (benign prostatic hypertrophy)   . Hematochezia     Referred to GI 05/17/2009, missed appointment.  . Elevated transaminase level     Mildly elevated SGPT  . Allergic rhinitis   . Constipation   . Carpal tunnel syndrome     S/P release transverse carpal ligament right wrist and decompression  median nerve right wrist by Dr. Lenard Galloway. Mortenson 02/25/2002.  Marland Kitchen Dyspnea on exertion     Spirometry normal 02/26/2001  . Anemia, unspecified     Mild normocytic anemia; Hgb=12.6 on 07/03/2005  . Atypical chest pain     Evaluated by Dr. Armanda Magic in 2002; adenosine cardiolite was negative then.  . Genital herpes   . Irregular heart beat   . Unspecified disorder of male genital organs     Patient reports occasional bleeding in small amount from area of skin irritation on scrotum, none recent; exam 04/26/2009 normal.  . Tonsillitis 10/07/2008  . Bronchitis     Past Surgical History    Procedure Date  . Carpal tunnel release 02/25/2002    S/P release transverse carpal ligament right wrist and decompression  median nerve right wrist by Dr. Lenard Galloway. Mortenson 02/25/2002.    Family History  Problem Relation Age of Onset  . Breast cancer Sister     2 sisters with breast cancer  . Prostate cancer Brother 22  . Diabetes type II Mother   . Kidney failure Mother   . Stroke Father     Late 48s    History  Substance Use Topics  . Smoking status: Former Smoker -- 0.5 packs/day for 5 years    Quit date: 01/21/1990  . Smokeless tobacco: Never Used  . Alcohol Use: No      Review of Systems  Constitutional: Negative for fever.  Cardiovascular: Negative for palpitations.  Gastrointestinal: Negative for nausea and vomiting.  Neurological: Positive for headaches.  All other systems reviewed and are negative.    Allergies  Clarithromycin  Home Medications   Current Outpatient Rx  Name Route Sig Dispense Refill  . ASPIRIN EC 81 MG PO TBEC Oral Take 81 mg by mouth daily.    . ATORVASTATIN CALCIUM 10 MG PO TABS  TAKE 1 TABLET (10 MG TOTAL) BY MOUTH DAILY. 30 tablet 8  . ACCU-CHEK COMPACT PLUS CARE KIT  Use as directed. 1 each 0  . BLOOD GLUCOSE METER KIT  Use to check blood  glucose twice a day before breakfast and supper     . CETIRIZINE HCL 10 MG PO TABS  TAKE 1 TABLET (10 MG TOTAL) BY MOUTH DAILY. 30 tablet 8  . ENALAPRIL MALEATE 2.5 MG PO TABS Oral Take 1 tablet (2.5 mg total) by mouth daily. 30 tablet 6  . FLUTICASONE PROPIONATE 50 MCG/ACT NA SUSP  PLACE 2 SPRAYS INTO THE NOSE DAILY. 16 g 11  . GLUCOSE BLOOD VI STRP  Use as directed to test blood sugar once daily.  ICD-9 250.00. 100 each 3  . HYDROCHLOROTHIAZIDE 25 MG PO TABS Oral Take 1 tablet (25 mg total) by mouth daily. 30 tablet 11  . ACCU-CHEK SOFTCLIX LANCET DEV MISC  Use as instructed 1 each 6  . LANCETS 28G MISC  Use to test blood glucose twice a day before breakfast and before supper     . METFORMIN  HCL 1000 MG PO TABS  TAKE 1 TABLET BY MOUTH TWICE A DAY 60 tablet 8  . OMEPRAZOLE 20 MG PO CPDR  TAKE ONE CAPSULE BY MOUTH TWICE A DAY 60 capsule 8  . PAROXETINE HCL 20 MG PO TABS Oral Take 1 tablet (20 mg total) by mouth daily. 30 tablet 6  . POLYETHYLENE GLYCOL 3350 PO PACK Oral Take 17 g by mouth daily as needed. For constipation. Mix in 4 - 8 ounces of liquid.     Marland Kitchen PROVENTIL HFA 108 (90 BASE) MCG/ACT IN AERS  INHALE 2 PUFFS INTO THE LUNGS 4 (FOUR) TIMES DAILY AS NEEDED FOR WHEEZING OR SHORTNESS OF BREATH. 1 Inhaler 11  . TAMSULOSIN HCL 0.4 MG PO CAPS  TAKE 1 CAPSULE (0.4 MG TOTAL) BY MOUTH DAILY. 30 capsule 9  . VALACYCLOVIR HCL 500 MG PO TABS Oral Take 1 tablet (500 mg total) by mouth daily. 30 tablet 6    BP 145/78  Pulse 75  Temp 97.6 F (36.4 C) (Oral)  Resp 20  Ht 5\' 8"  (1.727 m)  Wt 320 lb (145.151 kg)  BMI 48.66 kg/m2  SpO2 97%  Physical Exam  Nursing note and vitals reviewed. Constitutional: He is oriented to person, place, and time. He appears well-developed and well-nourished. No distress.  HENT:  Head: Normocephalic and atraumatic.       There is an area to the top of the scalp just to the left side that is ttp.  I am unable to appreciate any abnormality or see what appears to be an insect bite.    He is also not ttp over the temporal artery.  Eyes: EOM are normal. Pupils are equal, round, and reactive to light.  Neck: Normal range of motion. Neck supple.  Cardiovascular: Normal rate and regular rhythm.   No murmur heard. Pulmonary/Chest: Effort normal and breath sounds normal. No respiratory distress. He has no wheezes.  Abdominal: Soft. Bowel sounds are normal. He exhibits no distension. There is no tenderness.  Musculoskeletal: Normal range of motion. He exhibits no edema.  Neurological: He is alert and oriented to person, place, and time. No cranial nerve deficit. He exhibits normal muscle tone. Coordination normal.  Skin: Skin is warm and dry. He is not  diaphoretic.    ED Course  Procedures (including critical care time)  Labs Reviewed - No data to display No results found.   No diagnosis found.   Date: 07/23/2011  Rate: 73  Rhythm: normal sinus rhythm  QRS Axis: normal  Intervals: normal  ST/T Wave abnormalities: normal  Conduction Disutrbances:none  Narrative Interpretation:  Old EKG Reviewed: unchanged    MDM  The ct and ekg look okay with the exception of sinusitis which I doubt is the cause of his symptoms.  I will treat this with zmax however.  He will also be given medication for pain.  To return or follow up if symptoms persist or worsen.        Geoffery Lyons, MD 07/23/11 1311

## 2011-08-01 ENCOUNTER — Other Ambulatory Visit: Payer: Self-pay | Admitting: Internal Medicine

## 2011-08-14 ENCOUNTER — Ambulatory Visit (INDEPENDENT_AMBULATORY_CARE_PROVIDER_SITE_OTHER): Payer: Medicaid Other | Admitting: Internal Medicine

## 2011-08-14 ENCOUNTER — Encounter: Payer: Self-pay | Admitting: Internal Medicine

## 2011-08-14 VITALS — BP 139/82 | HR 76 | Temp 97.4°F | Wt 322.8 lb

## 2011-08-14 DIAGNOSIS — I10 Essential (primary) hypertension: Secondary | ICD-10-CM

## 2011-08-14 DIAGNOSIS — K219 Gastro-esophageal reflux disease without esophagitis: Secondary | ICD-10-CM

## 2011-08-14 DIAGNOSIS — M545 Low back pain: Secondary | ICD-10-CM

## 2011-08-14 DIAGNOSIS — Z1211 Encounter for screening for malignant neoplasm of colon: Secondary | ICD-10-CM

## 2011-08-14 DIAGNOSIS — G8929 Other chronic pain: Secondary | ICD-10-CM

## 2011-08-14 DIAGNOSIS — E119 Type 2 diabetes mellitus without complications: Secondary | ICD-10-CM

## 2011-08-14 DIAGNOSIS — F329 Major depressive disorder, single episode, unspecified: Secondary | ICD-10-CM

## 2011-08-14 DIAGNOSIS — Z79899 Other long term (current) drug therapy: Secondary | ICD-10-CM

## 2011-08-14 DIAGNOSIS — E785 Hyperlipidemia, unspecified: Secondary | ICD-10-CM

## 2011-08-14 LAB — POCT GLYCOSYLATED HEMOGLOBIN (HGB A1C): Hemoglobin A1C: 6.2

## 2011-08-14 MED ORDER — IBUPROFEN 400 MG PO TABS: 400.0000 mg | ORAL_TABLET | Freq: Two times a day (BID) | ORAL | Status: DC | PRN

## 2011-08-14 NOTE — Assessment & Plan Note (Signed)
Hemoglobin A1C  Date Value Range Status  08/14/2011 6.2   Final  03/13/2011 6.0   Final  04/18/2010 5.7   Final     Assessment: Diabetes control: controlled Progress toward goals: at goal Barriers to meeting goals: no barriers identified  Plan: Diabetes treatment: continue current medications Refer to: none Instruction/counseling given: reminded to get eye exam and discussed the need for weight loss

## 2011-08-14 NOTE — Progress Notes (Signed)
  Subjective:    Patient ID: Carlos Bean, male    DOB: 1960-05-28, 51 y.o.   MRN: [id number]  HPI Patient returns for followup of his diabetes mellitus, hyperlipidemia, hypertension, and other chronic medical problems.  He reports that he has been doing well, and he has no acute complaints today.  His home blood sugars have been doing well, with average value of 129 and a range from 97-166 over the past month; 93% of his measurements have been within target range, with no low measurements.  He reports that he has been compliant with his medications.  He has been using ibuprofen once or twice a day at most as needed for his chronic back pain.   Review of Systems  Respiratory: Positive for shortness of breath (Stable exertional dyspnea.). Negative for wheezing.   Cardiovascular: Negative for chest pain and leg swelling.  Gastrointestinal: Negative for nausea, vomiting, abdominal pain and blood in stool.       Objective:   Physical Exam  Constitutional: No distress.  Cardiovascular: Normal rate and regular rhythm.  Exam reveals no gallop and no friction rub.   No murmur heard. Pulmonary/Chest: Effort normal and breath sounds normal. He has no wheezes. He has no rales.  Abdominal: Soft. Bowel sounds are normal. There is no tenderness. There is no rebound and no guarding.  Musculoskeletal: He exhibits no edema.       Assessment & Plan:

## 2011-08-14 NOTE — Assessment & Plan Note (Signed)
Assessment: Patient is doing well without symptoms of depression on his current dose of Paxil 20 mg daily.  Plan: Continue Paxil 20 mg daily.

## 2011-08-14 NOTE — Assessment & Plan Note (Signed)
Assessment: Symptoms are well controlled on current regimen of omeprazole 20 mg twice a day.  Plan: Continue omeprazole 20 mg twice a day.

## 2011-08-14 NOTE — Assessment & Plan Note (Signed)
Assessment: Patient's back pain is reasonably well-controlled on his current regimen of ibuprofen 400 mg, which he is taking once or twice a day at most as needed for pain.  I discussed with him the importance of taking this medication only when he has significant pain, given the potential side effects of ibuprofen.  Plan: Continue ibuprofen 400 mg twice a day as needed for pain.

## 2011-08-14 NOTE — Patient Instructions (Addendum)
Please schedule an appointment with your eye doctor for your annual eye exam. Please complete and return Hemoccult cards as instructed. Please keep appointment for screening colonoscopy.

## 2011-08-14 NOTE — Assessment & Plan Note (Signed)
Lipids:    Component Value Date/Time   CHOL 165 03/13/2011 1257   TRIG 145 03/13/2011 1257   HDL 54 03/13/2011 1257   LDLCALC 82 03/13/2011 1257   VLDL 29 03/13/2011 1257   CHOLHDL 3.1 03/13/2011 1257    Assessment: Patient has good control of his hypercholesterolemia on his current regimen of atorvastatin 10 mg daily.  He has no apparent side effects of the medication.  Plan: Continue atorvastatin 10 mg daily.

## 2011-08-14 NOTE — Assessment & Plan Note (Signed)
Lab Results  Component Value Date   NA 140 03/13/2011   K 3.9 03/13/2011   CL 102 03/13/2011   CO2 27 03/13/2011   BUN 10 03/13/2011   CREATININE 1.01 03/13/2011    BP Readings from Last 3 Encounters:  08/14/11 139/82  07/23/11 145/78  03/13/11 155/93    Assessment: Hypertension control:  Controlled on current regimen of enalapril 2.5 mg daily and hydrochlorothiazide 25 mg daily.  Progress toward goals:  at goal Barriers to meeting goals:  no barriers identified  Plan: Hypertension treatment:  continue current medications

## 2011-08-20 ENCOUNTER — Other Ambulatory Visit (INDEPENDENT_AMBULATORY_CARE_PROVIDER_SITE_OTHER): Payer: Medicaid Other

## 2011-08-20 ENCOUNTER — Other Ambulatory Visit: Payer: Self-pay | Admitting: Internal Medicine

## 2011-08-20 DIAGNOSIS — Z1211 Encounter for screening for malignant neoplasm of colon: Secondary | ICD-10-CM

## 2011-08-20 LAB — POC HEMOCCULT BLD/STL (HOME/3-CARD/SCREEN)
Card #3 Fecal Occult Blood, POC: NEGATIVE
Fecal Occult Blood, POC: NEGATIVE

## 2011-08-21 ENCOUNTER — Encounter: Payer: Self-pay | Admitting: Internal Medicine

## 2011-10-02 NOTE — Addendum Note (Signed)
Addended by: Neomia Dear on: 10/02/2011 06:29 PM   Modules accepted: Orders

## 2011-10-14 ENCOUNTER — Encounter: Payer: Medicaid Other | Admitting: Internal Medicine

## 2011-11-07 ENCOUNTER — Other Ambulatory Visit: Payer: Self-pay | Admitting: Internal Medicine

## 2012-01-06 ENCOUNTER — Emergency Department (HOSPITAL_BASED_OUTPATIENT_CLINIC_OR_DEPARTMENT_OTHER)
Admission: EM | Admit: 2012-01-06 | Discharge: 2012-01-06 | Disposition: A | Discharge status: Home / Self Care | Payer: Medicaid Other | Attending: Emergency Medicine | Admitting: Emergency Medicine

## 2012-01-06 ENCOUNTER — Encounter (HOSPITAL_BASED_OUTPATIENT_CLINIC_OR_DEPARTMENT_OTHER): Payer: Self-pay | Admitting: *Deleted

## 2012-01-06 DIAGNOSIS — IMO0002 Reserved for concepts with insufficient information to code with codable children: Secondary | ICD-10-CM | POA: Insufficient documentation

## 2012-01-06 DIAGNOSIS — I10 Essential (primary) hypertension: Secondary | ICD-10-CM | POA: Insufficient documentation

## 2012-01-06 DIAGNOSIS — Z862 Personal history of diseases of the blood and blood-forming organs and certain disorders involving the immune mechanism: Secondary | ICD-10-CM | POA: Insufficient documentation

## 2012-01-06 DIAGNOSIS — K219 Gastro-esophageal reflux disease without esophagitis: Secondary | ICD-10-CM | POA: Insufficient documentation

## 2012-01-06 DIAGNOSIS — E669 Obesity, unspecified: Secondary | ICD-10-CM | POA: Insufficient documentation

## 2012-01-06 DIAGNOSIS — E785 Hyperlipidemia, unspecified: Secondary | ICD-10-CM | POA: Insufficient documentation

## 2012-01-06 DIAGNOSIS — Z8619 Personal history of other infectious and parasitic diseases: Secondary | ICD-10-CM | POA: Insufficient documentation

## 2012-01-06 DIAGNOSIS — Z7982 Long term (current) use of aspirin: Secondary | ICD-10-CM | POA: Insufficient documentation

## 2012-01-06 DIAGNOSIS — Z87448 Personal history of other diseases of urinary system: Secondary | ICD-10-CM | POA: Insufficient documentation

## 2012-01-06 DIAGNOSIS — Z881 Allergy status to other antibiotic agents status: Secondary | ICD-10-CM | POA: Insufficient documentation

## 2012-01-06 DIAGNOSIS — M545 Low back pain, unspecified: Secondary | ICD-10-CM | POA: Insufficient documentation

## 2012-01-06 DIAGNOSIS — Z87891 Personal history of nicotine dependence: Secondary | ICD-10-CM | POA: Insufficient documentation

## 2012-01-06 DIAGNOSIS — F329 Major depressive disorder, single episode, unspecified: Secondary | ICD-10-CM | POA: Insufficient documentation

## 2012-01-06 DIAGNOSIS — Z8669 Personal history of other diseases of the nervous system and sense organs: Secondary | ICD-10-CM | POA: Insufficient documentation

## 2012-01-06 DIAGNOSIS — Z9889 Other specified postprocedural states: Secondary | ICD-10-CM | POA: Insufficient documentation

## 2012-01-06 DIAGNOSIS — E119 Type 2 diabetes mellitus without complications: Secondary | ICD-10-CM | POA: Insufficient documentation

## 2012-01-06 DIAGNOSIS — Z8719 Personal history of other diseases of the digestive system: Secondary | ICD-10-CM | POA: Insufficient documentation

## 2012-01-06 DIAGNOSIS — Z8709 Personal history of other diseases of the respiratory system: Secondary | ICD-10-CM | POA: Insufficient documentation

## 2012-01-06 DIAGNOSIS — Z79899 Other long term (current) drug therapy: Secondary | ICD-10-CM | POA: Insufficient documentation

## 2012-01-06 DIAGNOSIS — G8929 Other chronic pain: Secondary | ICD-10-CM

## 2012-01-06 DIAGNOSIS — F3289 Other specified depressive episodes: Secondary | ICD-10-CM | POA: Insufficient documentation

## 2012-01-06 MED ORDER — HYDROMORPHONE HCL PF 2 MG/ML IJ SOLN
2.0000 mg | Freq: Once | INTRAMUSCULAR | Status: AC
  Administered 2012-01-06: 2 mg via INTRAMUSCULAR
  Filled 2012-01-06: qty 1

## 2012-01-06 MED ORDER — KETOROLAC TROMETHAMINE 60 MG/2ML IM SOLN
60.0000 mg | Freq: Once | INTRAMUSCULAR | Status: AC
  Administered 2012-01-06: 60 mg via INTRAMUSCULAR
  Filled 2012-01-06: qty 2

## 2012-01-06 MED ORDER — ONDANSETRON HCL 4 MG/2ML IJ SOLN
4.0000 mg | Freq: Once | INTRAMUSCULAR | Status: AC
  Administered 2012-01-06: 4 mg via INTRAMUSCULAR
  Filled 2012-01-06: qty 2

## 2012-01-06 MED ORDER — HYDROCODONE-ACETAMINOPHEN 5-325 MG PO TABS: 2.0000 | ORAL_TABLET | ORAL | Status: AC | PRN

## 2012-01-06 NOTE — ED Notes (Signed)
Pt c/o lower back pain which radiates down right leg x 4 days

## 2012-01-06 NOTE — ED Provider Notes (Signed)
History     CSN: [id number]  Arrival date & time 01/06/12  1254   First MD Initiated Contact with Patient 01/06/12 1313      Chief Complaint  Patient presents with  . Back Pain    (Consider location/radiation/quality/duration/timing/severity/associated sxs/prior treatment) Patient is a 51 y.o. male presenting with back pain. The history is provided by the patient. No language interpreter was used.  Back Pain  This is a new problem. Episode onset: 4 days. The problem occurs constantly. The problem has been gradually worsening. The pain is present in the lumbar spine. The quality of the pain is described as stabbing and aching. The symptoms are aggravated by certain positions. The pain is the same all the time. Stiffness is present all day. Pertinent negatives include no paresthesias. He has tried nothing for the symptoms. The treatment provided no relief.   Pt complains of pain in his right back.   Pt has a history of back problems.   Pt sees Dr. Meredith Pel in [facility].  Pt reports same pain he has had for years.  No relief with ibuprofen Past Medical History  Diagnosis Date  . Diabetes mellitus, type 2   . Hypertension   . Hyperlipemia   . GERD (gastroesophageal reflux disease)   . Depression   . Chronic low back pain   . Obesity   . BPH (benign prostatic hypertrophy)   . Hematochezia     Referred to GI 05/17/2009, missed appointment.  . Elevated transaminase level     Mildly elevated SGPT  . Allergic rhinitis   . Constipation   . Carpal tunnel syndrome     S/P release transverse carpal ligament right wrist and decompression  median nerve right wrist by Dr. Lenard Galloway. Mortenson 02/25/2002.  Marland Kitchen Dyspnea on exertion     Spirometry normal 02/26/2001  . Anemia, unspecified     Mild normocytic anemia; Hgb=12.6 on 07/03/2005  . Atypical chest pain     Evaluated by Dr. Armanda Magic in 2002; adenosine cardiolite was negative then.  . Genital herpes   . Irregular heart beat   . Unspecified  disorder of male genital organs     Patient reports occasional bleeding in small amount from area of skin irritation on scrotum, none recent; exam 04/26/2009 normal.  . Tonsillitis 10/07/2008  . Bronchitis   . Sinusitis 06/2011    Past Surgical History  Procedure Date  . Carpal tunnel release 02/25/2002    S/P release transverse carpal ligament right wrist and decompression  median nerve right wrist by Dr. Lenard Galloway. Mortenson 02/25/2002.    Family History  Problem Relation Age of Onset  . Breast cancer Sister     2 sisters with breast cancer  . Prostate cancer Brother 13  . Diabetes type II Mother   . Kidney failure Mother   . Stroke Father     Late 66s    History  Substance Use Topics  . Smoking status: Former Smoker -- 0.5 packs/day for 5 years    Quit date: 01/21/1990  . Smokeless tobacco: Never Used  . Alcohol Use: No      Review of Systems  Musculoskeletal: Positive for back pain.  Neurological: Negative for paresthesias.  All other systems reviewed and are negative.    Allergies  Clarithromycin  Home Medications   Current Outpatient Rx  Name  Route  Sig  Dispense  Refill  . ASPIRIN EC 81 MG PO TBEC   Oral   Take 81  mg by mouth daily.         . ATORVASTATIN CALCIUM 10 MG PO TABS      TAKE 1 TABLET (10 MG TOTAL) BY MOUTH DAILY.   30 tablet   8   . ACCU-CHEK COMPACT PLUS CARE KIT      Use as directed.   1 each   0   . BLOOD GLUCOSE METER KIT      Use to check blood glucose twice a day before breakfast and supper          . CETIRIZINE HCL 10 MG PO TABS      TAKE 1 TABLET (10 MG TOTAL) BY MOUTH DAILY.   30 tablet   8   . ENALAPRIL MALEATE 2.5 MG PO TABS      TAKE 1 TABLET (2.5 MG TOTAL) BY MOUTH DAILY.   30 tablet   6   . FLUTICASONE PROPIONATE 50 MCG/ACT NA SUSP      PLACE 2 SPRAYS INTO THE NOSE DAILY.   16 g   11   . GLUCOSE BLOOD VI STRP      Use as directed to test blood sugar once daily.  ICD-9 250.00.   100 each   3   .  HYDROCHLOROTHIAZIDE 25 MG PO TABS   Oral   Take 1 tablet (25 mg total) by mouth daily.   30 tablet   11   . IBUPROFEN 400 MG PO TABS   Oral   Take 1 tablet (400 mg total) by mouth 2 (two) times daily as needed for pain.   30 tablet   3   . ACCU-CHEK SOFTCLIX LANCET DEV MISC      Use as instructed   1 each   6   . LANCETS 28G MISC      Use to test blood glucose twice a day before breakfast and before supper          . METFORMIN HCL 1000 MG PO TABS      TAKE 1 TABLET BY MOUTH TWICE A DAY   60 tablet   8   . OMEPRAZOLE 20 MG PO CPDR      TAKE ONE CAPSULE BY MOUTH TWICE A DAY   60 capsule   8   . PAROXETINE HCL 20 MG PO TABS   Oral   Take 1 tablet (20 mg total) by mouth daily.   30 tablet   6   . PROVENTIL HFA 108 (90 BASE) MCG/ACT IN AERS      INHALE 2 PUFFS INTO THE LUNGS 4 (FOUR) TIMES DAILY AS NEEDED FOR WHEEZING OR SHORTNESS OF BREATH.   1 Inhaler   11   . PSYLLIUM 0.52 G PO CAPS      Take 1-3 times a day as needed for constipation; take as directed with a full glass of water.         Marland Kitchen SOLIFENACIN SUCCINATE 10 MG PO TABS   Oral   Take 10 mg by mouth daily.         Marland Kitchen TAMSULOSIN HCL 0.4 MG PO CAPS      TAKE 1 CAPSULE (0.4 MG TOTAL) BY MOUTH DAILY.   30 capsule   9   . VALACYCLOVIR HCL 500 MG PO TABS      TAKE 1 TABLET (500 MG TOTAL) BY MOUTH DAILY.   30 tablet   6     BP 138/84  Pulse 98  Temp 98.4 F (36.9 C)  Resp 16  Ht 5\' 8"  (1.727 m)  Wt 300 lb (136.079 kg)  BMI 45.61 kg/m2  SpO2 100%  Physical Exam  Nursing note and vitals reviewed. Constitutional: He is oriented to person, place, and time. He appears well-developed and well-nourished.  HENT:  Head: Normocephalic.  Right Ear: External ear normal.  Eyes: Conjunctivae normal are normal. Pupils are equal, round, and reactive to light.  Neck: Normal range of motion.  Cardiovascular: Normal rate.   Pulmonary/Chest: Effort normal.  Abdominal: Soft. Bowel sounds are normal.   Musculoskeletal: Normal range of motion.       Tender right lumbar  Neurological: He is alert and oriented to person, place, and time. He has normal reflexes.  Skin: Skin is warm.    ED Course  Procedures (including critical care time)  Labs Reviewed - No data to display No results found.   No diagnosis found.    MDM  Pt given torodol, dilaudid and zofran       Lonia Skinner [place], [place] 01/06/12 1439

## 2012-01-06 NOTE — ED Notes (Signed)
Pt reports disabled from chronic back pain since the late 1990's that is normally a 7/10 and is sometimes a 10/10, but today did not improve with ibuprofen or rest since Friday. Pain is in right lower back to right hip. Patient appears comfortable, ambulating WNL, in no apparent distress.

## 2012-01-06 NOTE — ED Notes (Signed)
Rx x 1 given for hydrocodone- pt has a ride at bedside

## 2012-01-11 NOTE — ED Provider Notes (Signed)
Medical screening examination/treatment/procedure(s) were performed by non-physician practitioner and as supervising physician I was immediately available for consultation/collaboration.   Rolan Bucco, MD 01/11/12 [phone]

## 2012-01-24 ENCOUNTER — Ambulatory Visit (INDEPENDENT_AMBULATORY_CARE_PROVIDER_SITE_OTHER): Payer: Medicaid Other | Admitting: Internal Medicine

## 2012-01-24 ENCOUNTER — Encounter: Payer: Self-pay | Admitting: Internal Medicine

## 2012-01-24 VITALS — BP 141/76 | HR 107 | Temp 96.9°F | Ht 68.0 in | Wt 322.6 lb

## 2012-01-24 DIAGNOSIS — Z299 Encounter for prophylactic measures, unspecified: Secondary | ICD-10-CM

## 2012-01-24 DIAGNOSIS — Z23 Encounter for immunization: Secondary | ICD-10-CM

## 2012-01-24 DIAGNOSIS — M545 Low back pain, unspecified: Secondary | ICD-10-CM

## 2012-01-24 DIAGNOSIS — E785 Hyperlipidemia, unspecified: Secondary | ICD-10-CM

## 2012-01-24 DIAGNOSIS — K219 Gastro-esophageal reflux disease without esophagitis: Secondary | ICD-10-CM

## 2012-01-24 DIAGNOSIS — I10 Essential (primary) hypertension: Secondary | ICD-10-CM

## 2012-01-24 DIAGNOSIS — Z79899 Other long term (current) drug therapy: Secondary | ICD-10-CM

## 2012-01-24 DIAGNOSIS — E119 Type 2 diabetes mellitus without complications: Secondary | ICD-10-CM

## 2012-01-24 LAB — LIPID PANEL
HDL: 46 mg/dL (ref >39)
LDL Cholesterol: 72 mg/dL (ref 0–99)
Total CHOL/HDL Ratio: 3.2 Ratio
Triglycerides: 137 mg/dL (ref <150)

## 2012-01-24 LAB — GLUCOSE, CAPILLARY: Glucose-Capillary: 136 mg/dL — ABNORMAL HIGH (ref 70–99)

## 2012-01-24 MED ORDER — ACETAMINOPHEN 500 MG PO TABS: 500.0000 mg | ORAL_TABLET | Freq: Four times a day (QID) | ORAL | Status: DC | PRN

## 2012-01-24 MED ORDER — ENALAPRIL MALEATE 5 MG PO TABS: 10.0000 mg | ORAL_TABLET | Freq: Every day | ORAL | Status: DC

## 2012-01-24 NOTE — Assessment & Plan Note (Signed)
Recent exacerbation of GERD symptoms, noticed after eating fried food. Reports taking omeprazole BID. Discussed using 30 min before meals and dietary modification for avoiding GERD symptoms. No abdominal pain on exam, no symptoms of cholelithiasis or ulcer.  -continue PPI BID -patient would like to try diet modification. Reassess at next visit.

## 2012-01-24 NOTE — Assessment & Plan Note (Signed)
Patient has been taking his lipitor without problems. He is due for a lipid panel next month so we will go ahead and order it today.  -FLP, CMP today -adjust lipitor dose if necessary

## 2012-01-24 NOTE — Assessment & Plan Note (Signed)
Patient with well controlled DM2. A1c is 6.5 today. Trending up from previous 6.2 in July 2013. Likely due to dietary indiscretions as patient admits to eating a lot of fried food. Denies hypoglycemic episodes.  -referral to ophthalmology for yearly exam -continue current meds, continue diet modifications as we discussed -follow up in three months

## 2012-01-24 NOTE — Assessment & Plan Note (Signed)
Goal <130/80, patient is not currently at goal. BP seems to be elevated consistently over past few visits.   -check CMP -increase enalapril to 10mg  QD -patient to call with any problems with this dose increase

## 2012-01-24 NOTE — Progress Notes (Signed)
Internal Medicine Clinic Visit    HPI:  Carlos Bean is a 52 y.o. year old male with a history of DM, HLD, HTN, and chronic back pain who presents for ED follow up after being seen 01/06/12 for back pain.  Patient has chronic low back pain from an injury in 1996. It is usually controlled with ibuprofen PRN but he developed pain not alleviated by his usually medicine and was unable to ambulate for a few days. He went to the ED, was given dilaudid, toradol, and zofran and rx for vicodin which helped. He states his back pain is now improved though it still aches worse than usual, worse with cold weather. Located in lumbar spine, radiates down the R leg. Previously had joint injections that worked temporarily, >8 years ago. He has flares of back problems on and off. Has tried PT in the past but not recently. Denies fever, weight loss, saddle anesthesia, weakness, incontinence.  Patient states that he is trying to lose weight as he knows this will help his back pain. Currently, he walks 1 mile 3x per week. Goal is to lose 22 lbs. Would also like to cut out fried food and decrease meat intake.  He also reports some GERD symptoms of burning and inability to burp, worse with fried food. Only has these symptoms after meals. No chest pain or shortness of breath. Occasionally hurts in both arms during these episodes.    Past Medical History  Diagnosis Date  . Diabetes mellitus, type 2   . Hypertension   . Hyperlipemia   . GERD (gastroesophageal reflux disease)   . Depression   . Chronic low back pain   . Obesity   . BPH (benign prostatic hypertrophy)   . Hematochezia     Referred to GI 05/17/2009, missed appointment.  . Elevated transaminase level     Mildly elevated SGPT  . Allergic rhinitis   . Constipation   . Carpal tunnel syndrome     S/P release transverse carpal ligament right wrist and decompression  median nerve right wrist by Dr. Lenard Galloway. Mortenson 02/25/2002.  Marland Kitchen Dyspnea on exertion      Spirometry normal 02/26/2001  . Anemia, unspecified     Mild normocytic anemia; Hgb=12.6 on 07/03/2005  . Atypical chest pain     Evaluated by Dr. Armanda Magic in 2002; adenosine cardiolite was negative then.  . Genital herpes   . Irregular heart beat   . Unspecified disorder of male genital organs     Patient reports occasional bleeding in small amount from area of skin irritation on scrotum, none recent; exam 04/26/2009 normal.  . Tonsillitis 10/07/2008  . Bronchitis   . Sinusitis 06/2011    Past Surgical History  Procedure Date  . Carpal tunnel release 02/25/2002    S/P release transverse carpal ligament right wrist and decompression  median nerve right wrist by Dr. Lenard Galloway. Mortenson 02/25/2002.     ROS:  A complete review of systems was otherwise negative, except as noted in the HPI.  Allergies: Clarithromycin  Medications: Current Outpatient Prescriptions  Medication Sig Dispense Refill  . aspirin EC 81 MG tablet Take 81 mg by mouth daily.      Marland Kitchen atorvastatin (LIPITOR) 10 MG tablet TAKE 1 TABLET (10 MG TOTAL) BY MOUTH DAILY.  30 tablet  8  . Blood Glucose Monitoring Suppl (ACCU-CHEK COMPACT CARE KIT) KIT Use as directed.  1 each  0  . Blood Glucose Monitoring Suppl (BLOOD GLUCOSE METER)  kit Use to check blood glucose twice a day before breakfast and supper       . cetirizine (ZYRTEC) 10 MG tablet TAKE 1 TABLET (10 MG TOTAL) BY MOUTH DAILY.  30 tablet  8  . enalapril (VASOTEC) 2.5 MG tablet TAKE 1 TABLET (2.5 MG TOTAL) BY MOUTH DAILY.  30 tablet  6  . fluticasone (FLONASE) 50 MCG/ACT nasal spray PLACE 2 SPRAYS INTO THE NOSE DAILY.  16 g  11  . glucose blood (ACCU-CHEK COMPACT STRIPS) test strip Use as directed to test blood sugar once daily.  ICD-9 250.00.  100 each  3  . hydrochlorothiazide (HYDRODIURIL) 25 MG tablet Take 1 tablet (25 mg total) by mouth daily.  30 tablet  11  . ibuprofen (ADVIL,MOTRIN) 400 MG tablet Take 1 tablet (400 mg total) by mouth 2 (two) times daily  as needed for pain.  30 tablet  3  . Lancet Devices (ACCU-CHEK SOFTCLIX) lancets Use as instructed  1 each  6  . Lancets 28G MISC Use to test blood glucose twice a day before breakfast and before supper       . metFORMIN (GLUCOPHAGE) 1000 MG tablet TAKE 1 TABLET BY MOUTH TWICE A DAY  60 tablet  8  . omeprazole (PRILOSEC) 20 MG capsule TAKE ONE CAPSULE BY MOUTH TWICE A DAY  60 capsule  8  . PARoxetine (PAXIL) 20 MG tablet Take 1 tablet (20 mg total) by mouth daily.  30 tablet  6  . PROVENTIL HFA 108 (90 BASE) MCG/ACT inhaler INHALE 2 PUFFS INTO THE LUNGS 4 (FOUR) TIMES DAILY AS NEEDED FOR WHEEZING OR SHORTNESS OF BREATH.  1 Inhaler  11  . psyllium (REGULOID) 0.52 G capsule Take 1-3 times a day as needed for constipation; take as directed with a full glass of water.      . solifenacin (VESICARE) 10 MG tablet Take 10 mg by mouth daily.      . Tamsulosin HCl (FLOMAX) 0.4 MG CAPS TAKE 1 CAPSULE (0.4 MG TOTAL) BY MOUTH DAILY.  30 capsule  9  . valACYclovir (VALTREX) 500 MG tablet TAKE 1 TABLET (500 MG TOTAL) BY MOUTH DAILY.  30 tablet  6    History   Social History  . Marital Status: Single    Spouse Name: N/A    Number of Children: N/A  . Years of Education: N/A   Occupational History  . Not on file.   Social History Main Topics  . Smoking status: Former Smoker -- 0.5 packs/day for 5 years    Quit date: 01/21/1990  . Smokeless tobacco: Never Used  . Alcohol Use: No  . Drug Use: No  . Sexually Active: Not on file   Other Topics Concern  . Not on file   Social History Narrative  . No narrative on file    family history includes Breast cancer in his sister; Diabetes type II in his mother; Kidney failure in his mother; Prostate cancer (age of onset:58) in his brother; and Stroke in his father.  Physical Exam Blood pressure 141/76, pulse 107, temperature 96.9 F (36.1 C), temperature source Oral, height 5\' 8"  (1.727 m), weight 322 lb 9.6 oz (146.33 kg), SpO2 97.00%. General:  No  acute distress, alert and oriented x 3, well-appearing  HEENT:  PERRL, EOMI, no lymphadenopathy, moist mucous membranes Cardiovascular:  Regular rate and rhythm, no murmurs, rubs or gallops Respiratory:  Clear to auscultation bilaterally, no wheezes, rales, or rhonchi Abdomen:  Soft, nondistended, nontender to  palpation, +bowel sounds Extremities:  Warm and well-perfused, no clubbing, cyanosis, or edema.  Skin: Warm, dry, no rashes Neuro: Not anxious appearing, no depressed mood, normal affect MSK: strength 5/5 throughout, sensation intact, LE reflexes 1+ symmetric, mildly tender to palpation of lumbar spine.  Labs: Lab Results  Component Value Date   CREATININE 1.01 03/13/2011   BUN 10 03/13/2011   NA 140 03/13/2011   K 3.9 03/13/2011   CL 102 03/13/2011   CO2 27 03/13/2011   Lab Results  Component Value Date   WBC 7.8 03/13/2011   HGB 12.5* 03/13/2011   HCT 39.2 03/13/2011   MCV 91.4 03/13/2011   PLT 325 03/13/2011      Assessment and Plan:    FOLLOWUP: Carlos Bean will follow back up in our clinic in approximately  3 months. Carlos Bean knows to call out clinic in the meantime with any questions or new issues.   Patient was evaluated by Denton Ar, MD,  and Aletta Edouard, MD Attending Physician.

## 2012-01-24 NOTE — Assessment & Plan Note (Signed)
Patient with recent flare of back pain. Seen in ED, given rx for vicodin. Patient is doing better but still having back aches. No red flag symtpoms. He is willing to try physical therapy as well as Tylenol and Ibuprofen at this time.  -refer for PT -Tylenol, Ibuprofen -patient knows to call if symptoms worsen or fail to improve in several weeks

## 2012-01-25 LAB — COMPLETE METABOLIC PANEL WITH GFR
AST: 21 U/L (ref 0–37)
Albumin: 4.6 g/dL (ref 3.5–5.2)
Alkaline Phosphatase: 56 U/L (ref 39–117)
Calcium: 10.1 mg/dL (ref 8.4–10.5)
Chloride: 101 mEq/L (ref 96–112)
Potassium: 3.8 mEq/L (ref 3.5–5.3)
Sodium: 139 mEq/L (ref 135–145)
Total Protein: 7.5 g/dL (ref 6.0–8.3)

## 2012-01-31 ENCOUNTER — Encounter (HOSPITAL_BASED_OUTPATIENT_CLINIC_OR_DEPARTMENT_OTHER): Payer: Self-pay | Admitting: Family Medicine

## 2012-01-31 ENCOUNTER — Emergency Department (HOSPITAL_BASED_OUTPATIENT_CLINIC_OR_DEPARTMENT_OTHER): Payer: Medicaid Other

## 2012-01-31 ENCOUNTER — Emergency Department (HOSPITAL_BASED_OUTPATIENT_CLINIC_OR_DEPARTMENT_OTHER)
Admission: EM | Admit: 2012-01-31 | Discharge: 2012-01-31 | Disposition: A | Discharge status: Home / Self Care | Payer: Medicaid Other | Attending: Emergency Medicine | Admitting: Emergency Medicine

## 2012-01-31 DIAGNOSIS — Z8679 Personal history of other diseases of the circulatory system: Secondary | ICD-10-CM | POA: Insufficient documentation

## 2012-01-31 DIAGNOSIS — R51 Headache: Secondary | ICD-10-CM | POA: Insufficient documentation

## 2012-01-31 DIAGNOSIS — J40 Bronchitis, not specified as acute or chronic: Secondary | ICD-10-CM

## 2012-01-31 DIAGNOSIS — I10 Essential (primary) hypertension: Secondary | ICD-10-CM | POA: Insufficient documentation

## 2012-01-31 DIAGNOSIS — Z8739 Personal history of other diseases of the musculoskeletal system and connective tissue: Secondary | ICD-10-CM | POA: Insufficient documentation

## 2012-01-31 DIAGNOSIS — Z79899 Other long term (current) drug therapy: Secondary | ICD-10-CM | POA: Insufficient documentation

## 2012-01-31 DIAGNOSIS — Z7982 Long term (current) use of aspirin: Secondary | ICD-10-CM | POA: Insufficient documentation

## 2012-01-31 DIAGNOSIS — K219 Gastro-esophageal reflux disease without esophagitis: Secondary | ICD-10-CM | POA: Insufficient documentation

## 2012-01-31 DIAGNOSIS — J111 Influenza due to unidentified influenza virus with other respiratory manifestations: Secondary | ICD-10-CM | POA: Insufficient documentation

## 2012-01-31 DIAGNOSIS — E785 Hyperlipidemia, unspecified: Secondary | ICD-10-CM | POA: Insufficient documentation

## 2012-01-31 DIAGNOSIS — Z8719 Personal history of other diseases of the digestive system: Secondary | ICD-10-CM | POA: Insufficient documentation

## 2012-01-31 DIAGNOSIS — M545 Low back pain, unspecified: Secondary | ICD-10-CM | POA: Insufficient documentation

## 2012-01-31 DIAGNOSIS — Z8619 Personal history of other infectious and parasitic diseases: Secondary | ICD-10-CM | POA: Insufficient documentation

## 2012-01-31 DIAGNOSIS — F329 Major depressive disorder, single episode, unspecified: Secondary | ICD-10-CM | POA: Insufficient documentation

## 2012-01-31 DIAGNOSIS — Z862 Personal history of diseases of the blood and blood-forming organs and certain disorders involving the immune mechanism: Secondary | ICD-10-CM | POA: Insufficient documentation

## 2012-01-31 DIAGNOSIS — Z87891 Personal history of nicotine dependence: Secondary | ICD-10-CM | POA: Insufficient documentation

## 2012-01-31 DIAGNOSIS — E669 Obesity, unspecified: Secondary | ICD-10-CM | POA: Insufficient documentation

## 2012-01-31 DIAGNOSIS — G8929 Other chronic pain: Secondary | ICD-10-CM | POA: Insufficient documentation

## 2012-01-31 DIAGNOSIS — Z87448 Personal history of other diseases of urinary system: Secondary | ICD-10-CM | POA: Insufficient documentation

## 2012-01-31 DIAGNOSIS — F3289 Other specified depressive episodes: Secondary | ICD-10-CM | POA: Insufficient documentation

## 2012-01-31 DIAGNOSIS — J3489 Other specified disorders of nose and nasal sinuses: Secondary | ICD-10-CM | POA: Insufficient documentation

## 2012-01-31 DIAGNOSIS — R062 Wheezing: Secondary | ICD-10-CM | POA: Insufficient documentation

## 2012-01-31 DIAGNOSIS — R509 Fever, unspecified: Secondary | ICD-10-CM | POA: Insufficient documentation

## 2012-01-31 DIAGNOSIS — E119 Type 2 diabetes mellitus without complications: Secondary | ICD-10-CM | POA: Insufficient documentation

## 2012-01-31 DIAGNOSIS — Z8709 Personal history of other diseases of the respiratory system: Secondary | ICD-10-CM | POA: Insufficient documentation

## 2012-01-31 DIAGNOSIS — R0602 Shortness of breath: Secondary | ICD-10-CM | POA: Insufficient documentation

## 2012-01-31 MED ORDER — AZITHROMYCIN 250 MG PO TABS: 250.0000 mg | ORAL_TABLET | Freq: Every day | ORAL | Status: DC

## 2012-01-31 MED ORDER — ALBUTEROL SULFATE HFA 108 (90 BASE) MCG/ACT IN AERS
1.0000 | INHALATION_SPRAY | RESPIRATORY_TRACT | Status: DC | PRN
  Administered 2012-01-31: 1 via RESPIRATORY_TRACT
  Filled 2012-01-31: qty 6.7

## 2012-01-31 MED ORDER — ALBUTEROL SULFATE (5 MG/ML) 0.5% IN NEBU
5.0000 mg | INHALATION_SOLUTION | Freq: Once | RESPIRATORY_TRACT | Status: AC
  Administered 2012-01-31: 5 mg via RESPIRATORY_TRACT
  Filled 2012-01-31: qty 1

## 2012-01-31 NOTE — ED Notes (Signed)
Pt c/o cough productive of yellow sputum x 7 days and sts chest "burns when I cough".

## 2012-01-31 NOTE — ED Provider Notes (Addendum)
History     CSN: [id number]  Arrival date & time 01/31/12  1004   First MD Initiated Contact with Patient 01/31/12 1022      Chief Complaint  Patient presents with  . Cough    (Consider location/radiation/quality/duration/timing/severity/associated sxs/prior treatment) Patient is a 52 y.o. male presenting with cough. The history is provided by the patient.  Cough This is a new problem. Episode onset: 6 days ago. The problem occurs constantly. The problem has not changed since onset.The cough is productive of sputum. The maximum temperature recorded prior to his arrival was 100 to 100.9 F. The fever has been present for 3 to 4 days. Associated symptoms include chills, headaches, rhinorrhea, myalgias, shortness of breath and wheezing. Pertinent negatives include no sore throat. He has tried decongestants for the symptoms. The treatment provided mild relief. He is not a smoker. His past medical history is significant for bronchitis. His past medical history does not include pneumonia, COPD or asthma.    Past Medical History  Diagnosis Date  . Diabetes mellitus, type 2   . Hypertension   . Hyperlipemia   . GERD (gastroesophageal reflux disease)   . Depression   . Chronic low back pain   . Obesity   . BPH (benign prostatic hypertrophy)   . Hematochezia     Referred to GI 05/17/2009, missed appointment.  . Elevated transaminase level     Mildly elevated SGPT  . Allergic rhinitis   . Constipation   . Carpal tunnel syndrome     S/P release transverse carpal ligament right wrist and decompression  median nerve right wrist by Dr. Lenard Galloway. Mortenson 02/25/2002.  Marland Kitchen Dyspnea on exertion     Spirometry normal 02/26/2001  . Anemia, unspecified     Mild normocytic anemia; Hgb=12.6 on 07/03/2005  . Atypical chest pain     Evaluated by Dr. Armanda Magic in 2002; adenosine cardiolite was negative then.  . Genital herpes   . Irregular heart beat   . Unspecified disorder of male genital organs       Patient reports occasional bleeding in small amount from area of skin irritation on scrotum, none recent; exam 04/26/2009 normal.  . Tonsillitis 10/07/2008  . Bronchitis   . Sinusitis 06/2011    Past Surgical History  Procedure Date  . Carpal tunnel release 02/25/2002    S/P release transverse carpal ligament right wrist and decompression  median nerve right wrist by Dr. Lenard Galloway. Mortenson 02/25/2002.    Family History  Problem Relation Age of Onset  . Breast cancer Sister     2 sisters with breast cancer  . Prostate cancer Brother 55  . Diabetes type II Mother   . Kidney failure Mother   . Stroke Father     Late 28s    History  Substance Use Topics  . Smoking status: Former Smoker -- 0.5 packs/day for 5 years    Quit date: 01/21/1990  . Smokeless tobacco: Never Used  . Alcohol Use: No      Review of Systems  Constitutional: Positive for chills.  HENT: Positive for rhinorrhea. Negative for sore throat.   Respiratory: Positive for cough, shortness of breath and wheezing.   Musculoskeletal: Positive for myalgias.  Neurological: Positive for headaches.  All other systems reviewed and are negative.    Allergies  Clarithromycin  Home Medications   Current Outpatient Rx  Name  Route  Sig  Dispense  Refill  . ACETAMINOPHEN 500 MG PO TABS  Oral   Take 1 tablet (500 mg total) by mouth every 6 (six) hours as needed for pain.   30 tablet   0   . ASPIRIN EC 81 MG PO TBEC   Oral   Take 81 mg by mouth daily.         . ATORVASTATIN CALCIUM 10 MG PO TABS      TAKE 1 TABLET (10 MG TOTAL) BY MOUTH DAILY.   30 tablet   8   . ACCU-CHEK COMPACT PLUS CARE KIT      Use as directed.   1 each   0   . BLOOD GLUCOSE METER KIT      Use to check blood glucose twice a day before breakfast and supper          . CETIRIZINE HCL 10 MG PO TABS      TAKE 1 TABLET (10 MG TOTAL) BY MOUTH DAILY.   30 tablet   8   . ENALAPRIL MALEATE 5 MG PO TABS   Oral   Take 2  tablets (10 mg total) by mouth daily.   60 tablet   6   . FLUTICASONE PROPIONATE 50 MCG/ACT NA SUSP      PLACE 2 SPRAYS INTO THE NOSE DAILY.   16 g   11   . GLUCOSE BLOOD VI STRP      Use as directed to test blood sugar once daily.  ICD-9 250.00.   100 each   3   . HYDROCHLOROTHIAZIDE 25 MG PO TABS   Oral   Take 1 tablet (25 mg total) by mouth daily.   30 tablet   11   . IBUPROFEN 400 MG PO TABS   Oral   Take 1 tablet (400 mg total) by mouth 2 (two) times daily as needed for pain.   30 tablet   3   . ACCU-CHEK SOFTCLIX LANCET DEV MISC      Use as instructed   1 each   6   . LANCETS 28G MISC      Use to test blood glucose twice a day before breakfast and before supper          . METFORMIN HCL 1000 MG PO TABS      TAKE 1 TABLET BY MOUTH TWICE A DAY   60 tablet   8   . OMEPRAZOLE 20 MG PO CPDR      TAKE ONE CAPSULE BY MOUTH TWICE A DAY   60 capsule   8   . PAROXETINE HCL 20 MG PO TABS   Oral   Take 1 tablet (20 mg total) by mouth daily.   30 tablet   6   . PROVENTIL HFA 108 (90 BASE) MCG/ACT IN AERS      INHALE 2 PUFFS INTO THE LUNGS 4 (FOUR) TIMES DAILY AS NEEDED FOR WHEEZING OR SHORTNESS OF BREATH.   1 Inhaler   11   . PSYLLIUM 0.52 G PO CAPS      Take 1-3 times a day as needed for constipation; take as directed with a full glass of water.         Marland Kitchen SOLIFENACIN SUCCINATE 10 MG PO TABS   Oral   Take 10 mg by mouth daily.         Marland Kitchen TAMSULOSIN HCL 0.4 MG PO CAPS      TAKE 1 CAPSULE (0.4 MG TOTAL) BY MOUTH DAILY.   30 capsule   9   . VALACYCLOVIR  HCL 500 MG PO TABS      TAKE 1 TABLET (500 MG TOTAL) BY MOUTH DAILY.   30 tablet   6     BP 171/96  Pulse 89  Temp 97.8 F (36.6 C) (Oral)  Resp 16  Ht 5\' 8"  (1.727 m)  Wt 322 lb (146.058 kg)  BMI 48.96 kg/m2  SpO2 98%  Physical Exam  Nursing note and vitals reviewed. Constitutional: He is oriented to person, place, and time. He appears well-developed and well-nourished. No  distress.  HENT:  Head: Normocephalic and atraumatic.  Right Ear: Tympanic membrane and ear canal normal.  Left Ear: Tympanic membrane and ear canal normal.  Mouth/Throat: Oropharynx is clear and moist and mucous membranes are normal.  Eyes: Conjunctivae normal and EOM are normal. Pupils are equal, round, and reactive to light.  Neck: Normal range of motion. Neck supple.  Cardiovascular: Normal rate, regular rhythm and intact distal pulses.   No murmur heard. Pulmonary/Chest: Effort normal. No respiratory distress. He has wheezes in the right upper field, the right middle field and the right lower field. He has no rales. He exhibits tenderness.  Abdominal: Soft. He exhibits no distension. There is no tenderness. There is no rebound and no guarding.  Musculoskeletal: Normal range of motion. He exhibits no edema and no tenderness.  Neurological: He is alert and oriented to person, place, and time.  Skin: Skin is warm and dry. No rash noted. No erythema.  Psychiatric: He has a normal mood and affect. His behavior is normal.    ED Course  Procedures (including critical care time)  Labs Reviewed - No data to display Dg Chest 2 View  01/31/2012  *RADIOLOGY REPORT*  Clinical Data: Cough. Hx diabetes, hyperlipidemia, hypertn, obesity, irregular hb, anemia.  CHEST - 2 VIEW  Comparison: 12/21/2008  Findings: Cardiac and mediastinal contours appear normal.  The lungs appear clear.  No pleural effusion is identified.  Mildly low lung volumes noted. Thoracic spondylosis is present.  IMPRESSION:  1.  No acute thoracic findings. 2.  Thoracic spondylosis.   Original Report Authenticated By: Gaylyn Rong, M.D.      1. Bronchitis   2. Influenza       MDM   Pt with symptoms consistent with influenza vs bronchitis with persisent cough and wheezing.  Sating 94% on RA.   No signs of breathing difficulty  No signs of strep pharyngitis, otitis or abnormal abdominal findings.   CXR pending and pt  with hx of bronchitis.  Will give albuterol for wheezing.   11:54 AM  Iproved after albuterol.  CXR unrevealing.  Given length of sx and complaints will start on a z-pack     Gwyneth Sprout, MD 01/31/12 1154  Gwyneth Sprout, MD 01/31/12 1205

## 2012-02-02 ENCOUNTER — Encounter (HOSPITAL_BASED_OUTPATIENT_CLINIC_OR_DEPARTMENT_OTHER): Payer: Self-pay | Admitting: *Deleted

## 2012-02-02 ENCOUNTER — Emergency Department (HOSPITAL_BASED_OUTPATIENT_CLINIC_OR_DEPARTMENT_OTHER)
Admission: EM | Admit: 2012-02-02 | Discharge: 2012-02-03 | Disposition: A | Discharge status: Home / Self Care | Payer: Medicaid Other | Attending: Emergency Medicine | Admitting: Emergency Medicine

## 2012-02-02 DIAGNOSIS — Z8719 Personal history of other diseases of the digestive system: Secondary | ICD-10-CM | POA: Insufficient documentation

## 2012-02-02 DIAGNOSIS — Z862 Personal history of diseases of the blood and blood-forming organs and certain disorders involving the immune mechanism: Secondary | ICD-10-CM | POA: Insufficient documentation

## 2012-02-02 DIAGNOSIS — N4289 Other specified disorders of prostate: Secondary | ICD-10-CM | POA: Insufficient documentation

## 2012-02-02 DIAGNOSIS — E785 Hyperlipidemia, unspecified: Secondary | ICD-10-CM | POA: Insufficient documentation

## 2012-02-02 DIAGNOSIS — Z7982 Long term (current) use of aspirin: Secondary | ICD-10-CM | POA: Insufficient documentation

## 2012-02-02 DIAGNOSIS — F329 Major depressive disorder, single episode, unspecified: Secondary | ICD-10-CM | POA: Insufficient documentation

## 2012-02-02 DIAGNOSIS — E669 Obesity, unspecified: Secondary | ICD-10-CM | POA: Insufficient documentation

## 2012-02-02 DIAGNOSIS — Z87891 Personal history of nicotine dependence: Secondary | ICD-10-CM | POA: Insufficient documentation

## 2012-02-02 DIAGNOSIS — N400 Enlarged prostate without lower urinary tract symptoms: Secondary | ICD-10-CM | POA: Insufficient documentation

## 2012-02-02 DIAGNOSIS — M545 Low back pain, unspecified: Secondary | ICD-10-CM | POA: Insufficient documentation

## 2012-02-02 DIAGNOSIS — R339 Retention of urine, unspecified: Secondary | ICD-10-CM

## 2012-02-02 DIAGNOSIS — F3289 Other specified depressive episodes: Secondary | ICD-10-CM | POA: Insufficient documentation

## 2012-02-02 DIAGNOSIS — Z791 Long term (current) use of non-steroidal anti-inflammatories (NSAID): Secondary | ICD-10-CM | POA: Insufficient documentation

## 2012-02-02 DIAGNOSIS — Z79899 Other long term (current) drug therapy: Secondary | ICD-10-CM | POA: Insufficient documentation

## 2012-02-02 DIAGNOSIS — K219 Gastro-esophageal reflux disease without esophagitis: Secondary | ICD-10-CM | POA: Insufficient documentation

## 2012-02-02 DIAGNOSIS — I10 Essential (primary) hypertension: Secondary | ICD-10-CM | POA: Insufficient documentation

## 2012-02-02 DIAGNOSIS — J40 Bronchitis, not specified as acute or chronic: Secondary | ICD-10-CM | POA: Insufficient documentation

## 2012-02-02 DIAGNOSIS — Z8739 Personal history of other diseases of the musculoskeletal system and connective tissue: Secondary | ICD-10-CM | POA: Insufficient documentation

## 2012-02-02 DIAGNOSIS — J309 Allergic rhinitis, unspecified: Secondary | ICD-10-CM | POA: Insufficient documentation

## 2012-02-02 DIAGNOSIS — G8929 Other chronic pain: Secondary | ICD-10-CM | POA: Insufficient documentation

## 2012-02-02 DIAGNOSIS — N390 Urinary tract infection, site not specified: Secondary | ICD-10-CM | POA: Insufficient documentation

## 2012-02-02 NOTE — ED Provider Notes (Signed)
History   This chart was scribed for Carlos Seedorf Smitty Cords, MD by Donne Anon, ED Scribe. This patient was seen in room MH01/MH01 and the patient's care was started at 2359.   CSN: [id number]  Arrival date & time 02/02/12  2142   First MD Initiated Contact with Patient 02/02/12 2359      Chief Complaint  Patient presents with  . Urinary Retention     The history is provided by the patient. No language interpreter was used.   Carlos Bean is a 52 y.o. male who presents to the Emergency Department complaining of gradual onset, moderate, constant urinary retention which began yesterday. He is currently on Flomax for a mildly enlarged prostate. He reports associated nausea but denies fever or any other pain. He states he took Mucinex for flu like symptoms last week.  He states he was urinating copiously a week ago when he was sick and did have some dysuria but now no urine in 24 or more hours.  No f/c/r.  No abdominal pain.  No n/v/d.  No rashes on the skin.  No trauma.     Past Medical History  Diagnosis Date  . Diabetes mellitus, type 2   . Hypertension   . Hyperlipemia   . GERD (gastroesophageal reflux disease)   . Depression   . Chronic low back pain   . Obesity   . BPH (benign prostatic hypertrophy)   . Hematochezia     Referred to GI 05/17/2009, missed appointment.  . Elevated transaminase level     Mildly elevated SGPT  . Allergic rhinitis   . Constipation   . Carpal tunnel syndrome     S/P release transverse carpal ligament right wrist and decompression  median nerve right wrist by Dr. Lenard Galloway. Mortenson 02/25/2002.  Marland Kitchen Dyspnea on exertion     Spirometry normal 02/26/2001  . Anemia, unspecified     Mild normocytic anemia; Hgb=12.6 on 07/03/2005  . Atypical chest pain     Evaluated by Dr. Armanda Magic in 2002; adenosine cardiolite was negative then.  . Genital herpes   . Irregular heart beat   . Unspecified disorder of male genital organs     Patient reports  occasional bleeding in small amount from area of skin irritation on scrotum, none recent; exam 04/26/2009 normal.  . Tonsillitis 10/07/2008  . Bronchitis   . Sinusitis 06/2011    Past Surgical History  Procedure Date  . Carpal tunnel release 02/25/2002    S/P release transverse carpal ligament right wrist and decompression  median nerve right wrist by Dr. Lenard Galloway. Mortenson 02/25/2002.    Family History  Problem Relation Age of Onset  . Breast cancer Sister     2 sisters with breast cancer  . Prostate cancer Brother 56  . Diabetes type II Mother   . Kidney failure Mother   . Stroke Father     Late 96s    History  Substance Use Topics  . Smoking status: Former Smoker -- 0.5 packs/day for 5 years    Quit date: 01/21/1990  . Smokeless tobacco: Never Used  . Alcohol Use: No      Review of Systems  Constitutional: Negative for fever.  Genitourinary: Positive for decreased urine volume. Negative for scrotal swelling and testicular pain.  All other systems reviewed and are negative.    Allergies  Clarithromycin  Home Medications   Current Outpatient Rx  Name  Route  Sig  Dispense  Refill  .  ACETAMINOPHEN 500 MG PO TABS   Oral   Take 1 tablet (500 mg total) by mouth every 6 (six) hours as needed for pain.   30 tablet   0   . ASPIRIN EC 81 MG PO TBEC   Oral   Take 81 mg by mouth daily.         . ATORVASTATIN CALCIUM 10 MG PO TABS      TAKE 1 TABLET (10 MG TOTAL) BY MOUTH DAILY.   30 tablet   8   . AZITHROMYCIN 250 MG PO TABS   Oral   Take 1 tablet (250 mg total) by mouth daily. Take first 2 tablets together, then 1 every day until finished.   6 tablet   0   . ACCU-CHEK COMPACT PLUS CARE KIT      Use as directed.   1 each   0   . BLOOD GLUCOSE METER KIT      Use to check blood glucose twice a day before breakfast and supper          . CETIRIZINE HCL 10 MG PO TABS      TAKE 1 TABLET (10 MG TOTAL) BY MOUTH DAILY.   30 tablet   8   . ENALAPRIL  MALEATE 5 MG PO TABS   Oral   Take 2 tablets (10 mg total) by mouth daily.   60 tablet   6   . FLUTICASONE PROPIONATE 50 MCG/ACT NA SUSP      PLACE 2 SPRAYS INTO THE NOSE DAILY.   16 g   11   . GLUCOSE BLOOD VI STRP      Use as directed to test blood sugar once daily.  ICD-9 250.00.   100 each   3   . HYDROCHLOROTHIAZIDE 25 MG PO TABS   Oral   Take 1 tablet (25 mg total) by mouth daily.   30 tablet   11   . IBUPROFEN 400 MG PO TABS   Oral   Take 1 tablet (400 mg total) by mouth 2 (two) times daily as needed for pain.   30 tablet   3   . ACCU-CHEK SOFTCLIX LANCET DEV MISC      Use as instructed   1 each   6   . LANCETS 28G MISC      Use to test blood glucose twice a day before breakfast and before supper          . METFORMIN HCL 1000 MG PO TABS      TAKE 1 TABLET BY MOUTH TWICE A DAY   60 tablet   8   . OMEPRAZOLE 20 MG PO CPDR      TAKE ONE CAPSULE BY MOUTH TWICE A DAY   60 capsule   8   . PAROXETINE HCL 20 MG PO TABS   Oral   Take 1 tablet (20 mg total) by mouth daily.   30 tablet   6   . PROVENTIL HFA 108 (90 BASE) MCG/ACT IN AERS      INHALE 2 PUFFS INTO THE LUNGS 4 (FOUR) TIMES DAILY AS NEEDED FOR WHEEZING OR SHORTNESS OF BREATH.   1 Inhaler   11   . PSYLLIUM 0.52 G PO CAPS      Take 1-3 times a day as needed for constipation; take as directed with a full glass of water.         Marland Kitchen SOLIFENACIN SUCCINATE 10 MG PO TABS   Oral  Take 10 mg by mouth daily.         Marland Kitchen TAMSULOSIN HCL 0.4 MG PO CAPS      TAKE 1 CAPSULE (0.4 MG TOTAL) BY MOUTH DAILY.   30 capsule   9   . VALACYCLOVIR HCL 500 MG PO TABS      TAKE 1 TABLET (500 MG TOTAL) BY MOUTH DAILY.   30 tablet   6     Triage Vitals: BP 115/76  Pulse 105  Temp 98.4 F (36.9 C)  Resp 16  Ht 5\' 8"  (1.727 m)  Wt 322 lb (146.058 kg)  BMI 48.96 kg/m2  SpO2 99%  Physical Exam  Vitals reviewed. Constitutional: He is oriented to person, place, and time. He appears  well-developed and well-nourished. No distress.  HENT:  Head: Normocephalic and atraumatic.  Mouth/Throat: Oropharynx is clear and moist. No oropharyngeal exudate.  Eyes: Conjunctivae normal are normal. Pupils are equal, round, and reactive to light.  Neck: Neck supple.  Cardiovascular: Normal rate, regular rhythm and normal heart sounds.   Pulmonary/Chest: Effort normal and breath sounds normal. No respiratory distress. He has no wheezes.  Abdominal: Soft. Bowel sounds are normal. He exhibits no distension. There is no tenderness. There is no rebound.  Genitourinary:       Bladder above synthesis pubis.  Musculoskeletal: Normal range of motion.  Neurological: He is alert and oriented to person, place, and time. He has normal reflexes.  Skin: Skin is warm and dry.  Psychiatric: He has a normal mood and affect. His behavior is normal.    ED Course  Procedures (including critical care time) DIAGNOSTIC STUDIES: Oxygen Saturation is 99% on room air, normal by my interpretation.    COORDINATION OF CARE: 12:01 AM Discussed treatment plan which includes a catheter and uninalysis with pt at bedside and pt agreed to plan.      Labs Reviewed  URINALYSIS, ROUTINE W REFLEX MICROSCOPIC   No results found.   No diagnosis found.    MDM  Foley catheter in good position, urine smells and looks infected.  Will treat for UTI.  Continue flomax.  You will need to follow up with Dr. Phoebe Perch in 7 days for foley removal and to discuss your enlarged prostate.  Return sooner for fevers, decreased foley output or any concerns.  Patient and wife verbalize understanding and agree to follow up I personally performed the services described in this documentation, which was scribed in my presence. The recorded information has been reviewed and is accurate.          Jasmine Awe, MD 02/03/12 [phone]

## 2012-02-02 NOTE — ED Notes (Signed)
Pt. C/o of urinary retention that started today. States he is "trickling" urine. C/o lower abd pressure. Denies any vomiting. C/o nausea.  Hx of prostate problems.

## 2012-02-03 ENCOUNTER — Emergency Department (HOSPITAL_BASED_OUTPATIENT_CLINIC_OR_DEPARTMENT_OTHER): Payer: Medicaid Other

## 2012-02-03 LAB — BASIC METABOLIC PANEL
CO2: 24 mEq/L (ref 19–32)
Chloride: 101 mEq/L (ref 96–112)
Glucose, Bld: 142 mg/dL — ABNORMAL HIGH (ref 70–99)
Sodium: 139 mEq/L (ref 135–145)

## 2012-02-03 LAB — URINE MICROSCOPIC-ADD ON

## 2012-02-03 LAB — CBC WITH DIFFERENTIAL/PLATELET
Basophils Relative: 0 % (ref 0–1)
Eosinophils Absolute: 0.1 10*3/uL (ref 0.0–0.7)
Eosinophils Relative: 1 % (ref 0–5)
HCT: 39.7 % (ref 39.0–52.0)
Hemoglobin: 12.8 g/dL — ABNORMAL LOW (ref 13.0–17.0)
Lymphocytes Relative: 43 % (ref 12–46)
MCH: 28.9 pg (ref 26.0–34.0)
MCHC: 32.2 g/dL (ref 30.0–36.0)
Neutro Abs: 3.9 10*3/uL (ref 1.7–7.7)
Neutrophils Relative %: 50 % (ref 43–77)
RBC: 4.43 MIL/uL (ref 4.22–5.81)

## 2012-02-03 LAB — URINALYSIS, ROUTINE W REFLEX MICROSCOPIC
Glucose, UA: NEGATIVE mg/dL
Ketones, ur: 15 mg/dL — AB
Nitrite: NEGATIVE
Specific Gravity, Urine: 1.025 (ref 1.005–1.030)
pH: 5.5 (ref 5.0–8.0)

## 2012-02-03 MED ORDER — KETOROLAC TROMETHAMINE 30 MG/ML IJ SOLN
30.0000 mg | Freq: Once | INTRAMUSCULAR | Status: DC
  Filled 2012-02-03: qty 1

## 2012-02-03 MED ORDER — CIPROFLOXACIN HCL 500 MG PO TABS: 500.0000 mg | ORAL_TABLET | Freq: Two times a day (BID) | ORAL | Status: DC

## 2012-02-03 MED ORDER — HYDROCODONE-ACETAMINOPHEN 5-325 MG PO TABS: 1.0000 | ORAL_TABLET | Freq: Four times a day (QID) | ORAL | Status: DC | PRN

## 2012-02-03 MED ORDER — CEFTRIAXONE SODIUM 1 G IJ SOLR
1.0000 g | Freq: Once | INTRAMUSCULAR | Status: AC
  Administered 2012-02-03 (×2): 1 g via INTRAVENOUS
  Filled 2012-02-03: qty 10

## 2012-02-03 MED ORDER — FENTANYL CITRATE 0.05 MG/ML IJ SOLN
50.0000 ug | Freq: Once | INTRAMUSCULAR | Status: DC
  Filled 2012-02-03: qty 2

## 2012-02-04 LAB — URINE CULTURE

## 2012-02-06 ENCOUNTER — Ambulatory Visit: Payer: Medicaid Other | Admitting: Physical Therapy

## 2012-02-12 ENCOUNTER — Ambulatory Visit: Payer: Medicaid Other | Admitting: Internal Medicine

## 2012-02-21 ENCOUNTER — Other Ambulatory Visit: Payer: Self-pay | Admitting: Internal Medicine

## 2012-02-25 NOTE — Addendum Note (Signed)
Addended by: Neomia Dear on: 02/25/2012 07:44 AM   Modules accepted: Orders

## 2012-04-27 ENCOUNTER — Other Ambulatory Visit: Payer: Self-pay | Admitting: Internal Medicine

## 2012-04-27 NOTE — Telephone Encounter (Signed)
Please schedule a follow-up appointment with me. 

## 2012-04-28 NOTE — Addendum Note (Signed)
Addended by: Neomia Dear on: 04/28/2012 07:53 PM   Modules accepted: Orders

## 2012-06-06 ENCOUNTER — Other Ambulatory Visit: Payer: Self-pay | Admitting: Internal Medicine

## 2012-06-22 ENCOUNTER — Other Ambulatory Visit: Payer: Self-pay | Admitting: Internal Medicine

## 2012-06-22 NOTE — Telephone Encounter (Signed)
Please schedule a follow up appointment with me and remind patient.

## 2012-06-30 ENCOUNTER — Encounter: Payer: Self-pay | Admitting: Dietician

## 2012-07-30 ENCOUNTER — Other Ambulatory Visit: Payer: Self-pay

## 2012-08-07 ENCOUNTER — Other Ambulatory Visit: Payer: Self-pay | Admitting: Internal Medicine

## 2012-08-07 ENCOUNTER — Other Ambulatory Visit: Payer: Self-pay | Admitting: *Deleted

## 2012-08-07 MED ORDER — OMEPRAZOLE 20 MG PO CPDR: 20.0000 mg | DELAYED_RELEASE_CAPSULE | Freq: Two times a day (BID) | ORAL | Status: DC

## 2012-08-07 MED ORDER — METFORMIN HCL 1000 MG PO TABS: 1000.0000 mg | ORAL_TABLET | Freq: Two times a day (BID) | ORAL | Status: DC

## 2012-08-07 MED ORDER — ATORVASTATIN CALCIUM 10 MG PO TABS: 10.0000 mg | ORAL_TABLET | Freq: Every day | ORAL | Status: DC

## 2012-08-07 NOTE — Telephone Encounter (Signed)
Pt last seen JAn and no showed a F/U appt. Dr Meredith Pel requested appt at last refill he did but no appt given. Therefore, refill 3 month supply but must have appt for any more refills.

## 2012-08-19 ENCOUNTER — Ambulatory Visit (INDEPENDENT_AMBULATORY_CARE_PROVIDER_SITE_OTHER): Payer: Medicaid Other | Admitting: Internal Medicine

## 2012-08-19 ENCOUNTER — Encounter: Payer: Self-pay | Admitting: Internal Medicine

## 2012-08-19 VITALS — BP 147/86 | HR 86 | Temp 97.3°F | Ht 68.0 in | Wt 316.9 lb

## 2012-08-19 DIAGNOSIS — E785 Hyperlipidemia, unspecified: Secondary | ICD-10-CM

## 2012-08-19 DIAGNOSIS — I10 Essential (primary) hypertension: Secondary | ICD-10-CM

## 2012-08-19 DIAGNOSIS — N400 Enlarged prostate without lower urinary tract symptoms: Secondary | ICD-10-CM

## 2012-08-19 DIAGNOSIS — K219 Gastro-esophageal reflux disease without esophagitis: Secondary | ICD-10-CM

## 2012-08-19 DIAGNOSIS — E119 Type 2 diabetes mellitus without complications: Secondary | ICD-10-CM

## 2012-08-19 DIAGNOSIS — Z0000 Encounter for general adult medical examination without abnormal findings: Secondary | ICD-10-CM | POA: Insufficient documentation

## 2012-08-19 DIAGNOSIS — M545 Low back pain: Secondary | ICD-10-CM

## 2012-08-19 LAB — COMPLETE METABOLIC PANEL WITH GFR
ALT: 61 U/L — ABNORMAL HIGH (ref 0–53)
AST: 38 U/L — ABNORMAL HIGH (ref 0–37)
Albumin: 4.6 g/dL (ref 3.5–5.2)
Alkaline Phosphatase: 64 U/L (ref 39–117)
BUN: 15 mg/dL (ref 6–23)
Calcium: 9.9 mg/dL (ref 8.4–10.5)
Chloride: 100 mEq/L (ref 96–112)
Potassium: 4.2 mEq/L (ref 3.5–5.3)
Sodium: 136 mEq/L (ref 135–145)
Total Protein: 7.9 g/dL (ref 6.0–8.3)

## 2012-08-19 LAB — CBC WITH DIFFERENTIAL/PLATELET
Basophils Relative: 0 % (ref 0–1)
Eosinophils Absolute: 0.1 10*3/uL (ref 0.0–0.7)
Lymphs Abs: 3.4 10*3/uL (ref 0.7–4.0)
MCH: 28.8 pg (ref 26.0–34.0)
MCHC: 32.1 g/dL (ref 30.0–36.0)
Neutro Abs: 2.7 10*3/uL (ref 1.7–7.7)
Neutrophils Relative %: 40 % — ABNORMAL LOW (ref 43–77)
Platelets: 362 10*3/uL (ref 150–400)
RBC: 4.66 MIL/uL (ref 4.22–5.81)

## 2012-08-19 LAB — LIPID PANEL
Cholesterol: 136 mg/dL (ref 0–200)
LDL Cholesterol: 69 mg/dL (ref 0–99)
Total CHOL/HDL Ratio: 2.7 Ratio
VLDL: 17 mg/dL (ref 0–40)

## 2012-08-19 LAB — POCT GLYCOSYLATED HEMOGLOBIN (HGB A1C): Hemoglobin A1C: 6.4

## 2012-08-19 LAB — GLUCOSE, CAPILLARY: Glucose-Capillary: 130 mg/dL — ABNORMAL HIGH (ref 70–99)

## 2012-08-19 MED ORDER — TRAMADOL HCL 50 MG PO TABS: 50.0000 mg | ORAL_TABLET | Freq: Three times a day (TID) | ORAL | Status: DC | PRN

## 2012-08-19 MED ORDER — ENALAPRIL MALEATE 5 MG PO TABS: 5.0000 mg | ORAL_TABLET | Freq: Every day | ORAL | Status: DC

## 2012-08-19 NOTE — Assessment & Plan Note (Signed)
Assessment: Although we referred patient for ophthalmology evaluation and for a screening colonoscopy, he did not keep either appointment and did not reschedule.  Plan: I advised patient to call and reschedule his ophthalmology appointment and his screening colonoscopy.

## 2012-08-19 NOTE — Assessment & Plan Note (Signed)
BP Readings from Last 3 Encounters:  08/19/12 147/86  02/02/12 115/76  01/31/12 171/96    Lab Results  Component Value Date   NA 139 02/03/2012   K 3.7 02/03/2012   CREATININE 1.30 02/03/2012    Assessment: Blood pressure control: mildly elevated Progress toward BP goal:  unchanged Comments: Blood pressure is slightly above goal on enalapril (Vasotec) 2.5 mg daily (patient did not increase previously as directed) and hydrochlorothiazide 25 mg daily.  Plan: Medications:  Increase enalapril to a dose of 5 mg daily; continue hydrochlorothiazide 25 mg daily.

## 2012-08-19 NOTE — Assessment & Plan Note (Signed)
Assessment: Patient reports occasional increased symptoms of GERD.  Plan: Continue omeprazole 20 mg twice a day; I advised patient to stop ibuprofen and start using tramadol for his back pain instead.

## 2012-08-19 NOTE — Progress Notes (Signed)
  Subjective:    Patient ID: Carlos Bean, male    DOB: 02-18-1960, 52 y.o.   MRN: [id number]  HPI Patient returns for followup of his diabetes mellitus, hypertension, and other chronic medical problems.  His main complaint is increased back pain and bilateral shoulder pain since he has been out of ibuprofen.  He also reports episodes of increased GERD symptoms with indigestion and acid reflux.  He was seen in the emergency department in January with urinary obstruction and reports that he had a Foley catheter in place for several days; this was subsequently removed and he is currently voiding without problems.  He reports that he is currently followed by a urologist Dr. Lindley Magnus at [facility] in [facility], whom he says started him on a "blue pill" in addition to his Flomax, but he does not recall the name of that medication and did not bring the medication to clinic today.  He has not been checking his blood sugars recently.   Review of Systems  Constitutional: Negative for fever and chills.  Respiratory: Negative for shortness of breath.   Cardiovascular: Negative for chest pain and leg swelling.  Gastrointestinal: Negative for nausea, vomiting and abdominal pain.  Genitourinary: Negative for dysuria and difficulty urinating.       Objective:   Physical Exam  Constitutional: No distress.  Cardiovascular: Normal rate.  Exam reveals no gallop and no friction rub.   No murmur heard. Pulmonary/Chest: Effort normal and breath sounds normal. No respiratory distress. He has no wheezes. He has no rales.  Abdominal: Soft. Bowel sounds are normal. He exhibits no distension. There is no tenderness. There is no rebound and no guarding.  Musculoskeletal: He exhibits no edema.        Assessment & Plan:

## 2012-08-19 NOTE — Assessment & Plan Note (Signed)
Assessment: Patient is now followed by a urologist in [facility].  He reports no episodes of obstruction since he had a temporary Foley in January.  He is on tamsulosin and another medication prescribed by his urologist.  Plan: Continue tamsulosin; I advised patient to bring in his other medication so we can add it to his medication list; he will followup with his urologist as scheduled.

## 2012-08-19 NOTE — Patient Instructions (Addendum)
General Instructions: Increase enalapril to a dose of 5 mg once daily. Please schedule an appointment with your eye doctor for your annual eye exam. Please schedule an appointment for colonoscopy.    Progress Toward Treatment Goals:  Treatment Goal 08/19/2012  Hemoglobin A1C at goal  Blood pressure unchanged    Self Care Goals & Plans:  Self Care Goal 08/19/2012  Manage my medications take my medicines as prescribed; bring my medications to every visit; refill my medications on time; follow the sick day instructions if I am sick  Monitor my health keep track of my blood glucose; bring my glucose meter and log to each visit; keep track of my blood pressure; keep track of my weight; check my feet daily  Eat healthy foods eat fruit for snacks and desserts; eat more vegetables; eat foods that are low in salt; eat baked foods instead of fried foods; eat smaller portions  Be physically active take a walk every day; find an activity I enjoy; take the stairs instead of the elevator    Home Blood Glucose Monitoring 08/19/2012  Check my blood sugar once a day  When to check my blood sugar before breakfast     Care Management & Community Referrals:  Referral 08/19/2012  Referrals made for care management support none needed  Referrals made to community resources none

## 2012-08-19 NOTE — Assessment & Plan Note (Signed)
Lipids:    Component Value Date/Time   CHOL 145 01/24/2012 1528   TRIG 137 01/24/2012 1528   HDL 46 01/24/2012 1528   LDLCALC 72 01/24/2012 1528   VLDL 27 01/24/2012 1528   CHOLHDL 3.2 01/24/2012 1528    Assessment: LDL is at goal on his current regimen of atorvastatin 10 mg daily.  He has no apparent side effects of the medication.  Plan: Continue atorvastatin 10 mg daily;  check a lipid panel and comprehensive metabolic panel today.

## 2012-08-19 NOTE — Assessment & Plan Note (Signed)
Lab Results  Component Value Date   HGBA1C 6.4 08/19/2012   HGBA1C 6.5 01/24/2012   HGBA1C 6.2 08/14/2011     Assessment: Diabetes control: good control (HgbA1C at goal) Progress toward A1C goal:  at goal Comments: Diabetes is well controlled on metformin 1000 mg twice a day  Plan: Medications:  continue current medications Home glucose monitoring: Frequency: once a day Timing: before breakfast Instruction/counseling given: reminded to get eye exam and reminded to bring blood glucose meter & log to each visit

## 2012-08-19 NOTE — Assessment & Plan Note (Addendum)
Assessment: Patient reports some increase in his chronic back pain since he has been out of ibuprofen.  Plan: Given his increased GERD symptoms, the plan is to stop ibuprofen and start tramadol 50 mg every 8 hours as needed for back pain.  I discussed potential side effects with patient and advised him to let me know immediately if he has any problems.

## 2012-08-20 LAB — MICROALBUMIN / CREATININE URINE RATIO
Creatinine, Urine: 134.6 mg/dL
Microalb, Ur: 3.99 mg/dL — ABNORMAL HIGH (ref 0.00–1.89)

## 2012-08-28 ENCOUNTER — Other Ambulatory Visit: Payer: Self-pay | Admitting: Internal Medicine

## 2012-08-28 NOTE — Progress Notes (Signed)
Quick Note:  Transaminases are mildly elevated; patient is asymptomatic. The plan is to recheck in one month. ______

## 2012-08-28 NOTE — Progress Notes (Signed)
Message sent to front desk

## 2012-08-28 NOTE — Progress Notes (Signed)
Please have patient come in in one month for repeat labs to check his liver enzymes; these were mildly elevated.  He is asymptomatic, and I do not plan to change his medication regimen at this time.

## 2012-09-23 ENCOUNTER — Other Ambulatory Visit: Payer: Self-pay | Admitting: Internal Medicine

## 2012-09-23 NOTE — Telephone Encounter (Signed)
Refills approved - nurse to call in tramadol. 

## 2012-09-23 NOTE — Telephone Encounter (Signed)
Please have patient come in for follow-up comprehensive metabolic panel.

## 2012-09-24 NOTE — Telephone Encounter (Signed)
Called to pharm 

## 2012-11-11 ENCOUNTER — Other Ambulatory Visit: Payer: Self-pay | Admitting: Internal Medicine

## 2012-11-11 DIAGNOSIS — E785 Hyperlipidemia, unspecified: Secondary | ICD-10-CM

## 2012-11-11 NOTE — Telephone Encounter (Signed)
I will refill for one month.  Please have patient come in this week for CMP to follow-up mildly elevated liver enzymes.

## 2012-11-12 NOTE — Telephone Encounter (Addendum)
Talked to pt - he can come Monday 10/27@ 1100AM for lab.

## 2012-11-16 ENCOUNTER — Ambulatory Visit (INDEPENDENT_AMBULATORY_CARE_PROVIDER_SITE_OTHER): Payer: Medicaid Other | Admitting: *Deleted

## 2012-11-16 ENCOUNTER — Other Ambulatory Visit (INDEPENDENT_AMBULATORY_CARE_PROVIDER_SITE_OTHER): Payer: Medicaid Other

## 2012-11-16 DIAGNOSIS — E785 Hyperlipidemia, unspecified: Secondary | ICD-10-CM

## 2012-11-16 DIAGNOSIS — Z23 Encounter for immunization: Secondary | ICD-10-CM

## 2012-11-16 LAB — COMPLETE METABOLIC PANEL WITH GFR
ALT: 37 U/L (ref 0–53)
AST: 28 U/L (ref 0–37)
Albumin: 4.6 g/dL (ref 3.5–5.2)
BUN: 12 mg/dL (ref 6–23)
Creat: 1.12 mg/dL (ref 0.50–1.35)
GFR, Est African American: 87 mL/min
GFR, Est Non African American: 75 mL/min
Total Bilirubin: 0.6 mg/dL (ref 0.3–1.2)

## 2012-11-25 ENCOUNTER — Ambulatory Visit (INDEPENDENT_AMBULATORY_CARE_PROVIDER_SITE_OTHER): Payer: Medicaid Other | Admitting: Internal Medicine

## 2012-11-25 ENCOUNTER — Encounter: Payer: Self-pay | Admitting: Internal Medicine

## 2012-11-25 VITALS — BP 129/74 | HR 84 | Temp 98.0°F | Wt 323.3 lb

## 2012-11-25 DIAGNOSIS — Z0000 Encounter for general adult medical examination without abnormal findings: Secondary | ICD-10-CM

## 2012-11-25 DIAGNOSIS — F3289 Other specified depressive episodes: Secondary | ICD-10-CM

## 2012-11-25 DIAGNOSIS — M545 Low back pain, unspecified: Secondary | ICD-10-CM

## 2012-11-25 DIAGNOSIS — E785 Hyperlipidemia, unspecified: Secondary | ICD-10-CM

## 2012-11-25 DIAGNOSIS — E119 Type 2 diabetes mellitus without complications: Secondary | ICD-10-CM

## 2012-11-25 DIAGNOSIS — K219 Gastro-esophageal reflux disease without esophagitis: Secondary | ICD-10-CM

## 2012-11-25 DIAGNOSIS — I10 Essential (primary) hypertension: Secondary | ICD-10-CM

## 2012-11-25 DIAGNOSIS — Z1211 Encounter for screening for malignant neoplasm of colon: Secondary | ICD-10-CM

## 2012-11-25 DIAGNOSIS — F329 Major depressive disorder, single episode, unspecified: Secondary | ICD-10-CM

## 2012-11-25 LAB — GLUCOSE, CAPILLARY: Glucose-Capillary: 140 mg/dL — ABNORMAL HIGH (ref 70–99)

## 2012-11-25 LAB — POCT GLYCOSYLATED HEMOGLOBIN (HGB A1C): Hemoglobin A1C: 6

## 2012-11-25 MED ORDER — RANITIDINE HCL 150 MG PO CAPS: 150.0000 mg | ORAL_CAPSULE | Freq: Every day | ORAL | Status: DC

## 2012-11-25 MED ORDER — TRAMADOL HCL 50 MG PO TABS: 50.0000 mg | ORAL_TABLET | Freq: Three times a day (TID) | ORAL | Status: DC | PRN

## 2012-11-25 MED ORDER — PAROXETINE HCL 20 MG PO TABS: 20.0000 mg | ORAL_TABLET | Freq: Every day | ORAL | Status: DC

## 2012-11-25 MED ORDER — ATORVASTATIN CALCIUM 10 MG PO TABS: 10.0000 mg | ORAL_TABLET | Freq: Every day | ORAL | Status: DC

## 2012-11-25 NOTE — Assessment & Plan Note (Signed)
Assessment: Patient has not rescheduled his eye exam or colonoscopy after previously no showing for those appointments.  I discussed at length with him today the importance of having those screening tests done.     Plan: I referred patient for retinal photography in our clinic, and I placed a referral ordered today for ophthalmology referral and for GI referral for colonoscopy.  I emphasized to the patient the importance of keeping those appointments.

## 2012-11-25 NOTE — Assessment & Plan Note (Signed)
BP Readings from Last 3 Encounters:  11/25/12 129/74  08/19/12 147/86  02/02/12 115/76    Lab Results  Component Value Date   NA 134* 11/16/2012   K 4.2 11/16/2012   CREATININE 1.12 11/16/2012    Assessment: Blood pressure control: controlled Progress toward BP goal:  at goal Comments: blood pressure is controlled on enalapril 5 mg daily and HCTZ 25 mg daily  Plan: Medications:  continue current medications Educational resources provided: brochure Self management tools provided: home blood pressure logbook

## 2012-11-25 NOTE — Assessment & Plan Note (Signed)
Assessment: Patient is doing well on Paxil 20 mg daily, with no symptoms of depression.  Plan: Continue Paxil 20 mg daily.

## 2012-11-25 NOTE — Progress Notes (Signed)
  Subjective:    Patient ID: Carlos Bean, male    DOB: 1960/06/06, 52 y.o.   MRN: [id number]  HPI Patient returns for followup of his diabetes mellitus, hypertension, hyperlipidemia, and other chronic medical problems.  Today he has no acute complaints.  He reports that his back pain is well controlled on tramadol, which he takes on an as-needed basis.  He reports some ongoing problems with chronic reflux symptoms.  His blood sugars have been well controlled at home; he did not bring his meter today.  His depression is well controlled on his current dose of Paxil.  He reports that he has been compliant with his medications.   Review of Systems  Constitutional: Negative for fever, chills and diaphoresis.  Respiratory: Negative for cough and shortness of breath.   Cardiovascular: Negative for chest pain.  Gastrointestinal: Negative for nausea, vomiting and abdominal pain.  Endocrine: Negative for polyuria.  Genitourinary: Positive for difficulty urinating (Occasional hesitancy, improved on Flomax). Negative for dysuria.  Musculoskeletal: Positive for back pain (Controlled on tramadol.).  Psychiatric/Behavioral: Negative for suicidal ideas and dysphoric mood.       Objective:   Physical Exam  Constitutional: No distress.  Cardiovascular: Normal rate, regular rhythm and normal heart sounds.  Exam reveals no gallop and no friction rub.   No murmur heard. Pulmonary/Chest: Effort normal and breath sounds normal. No respiratory distress. He has no wheezes. He has no rales.  Abdominal: Soft. Bowel sounds are normal. He exhibits no distension. There is no tenderness. There is no rebound and no guarding.       Assessment & Plan:

## 2012-11-25 NOTE — Assessment & Plan Note (Signed)
Lipids:    Component Value Date/Time   CHOL 136 08/19/2012 1136   TRIG 87 08/19/2012 1136   HDL 50 08/19/2012 1136   LDLCALC 69 08/19/2012 1136   VLDL 17 08/19/2012 1136   CHOLHDL 2.7 08/19/2012 1136    Assessment: LDL is at goal on atorvastatin 10 mg daily.  Patient has no apparent side effects of the medication.  Plan: Continue atorvastatin 10 mg daily.

## 2012-11-25 NOTE — Assessment & Plan Note (Signed)
Lab Results  Component Value Date   HGBA1C 6.0 11/25/2012   HGBA1C 6.4 08/19/2012   HGBA1C 6.5 01/24/2012     Assessment: Diabetes control: good control (HgbA1C at goal) Progress toward A1C goal:  at goal Comments: diabetes is well controlled on metformin 1000 mg twice a day  Plan: Medications:  continue current medications Home glucose monitoring: Frequency: once a day Timing: before breakfast Instruction/counseling given: reminded to get eye exam Educational resources provided: brochure Self management tools provided: copy of home glucose meter download Other plans: referred for retinal screening photography today

## 2012-11-25 NOTE — Assessment & Plan Note (Signed)
Assessment: Patient still reports reflux symptoms despite being off of ibuprofen and on tramadol for pain control.  Plan: Continue omeprazole 20 mg twice a day; start ranitidine 150 mg at bedtime.

## 2012-11-25 NOTE — Patient Instructions (Addendum)
General Instructions: Start ranitidine 150 mg one capsule daily at bedtime for reflux. Please call and schedule a followup visit with your eye doctor. A referral has been made for colonoscopy for colon cancer screening; please be sure to keep your appointment. Please complete and return the Hemoccult cards as instructed.  Progress Toward Treatment Goals:  Treatment Goal 11/25/2012  Hemoglobin A1C at goal  Blood pressure at goal    Self Care Goals & Plans:  Self Care Goal 11/25/2012  Manage my medications take my medicines as prescribed; bring my medications to every visit; refill my medications on time  Monitor my health bring my glucose meter and log to each visit; keep track of my blood glucose  Eat healthy foods eat baked foods instead of fried foods; eat foods that are low in salt  Be physically active find an activity I enjoy  Meeting treatment goals maintain the current self-care plan    Home Blood Glucose Monitoring 11/25/2012  Check my blood sugar once a day  When to check my blood sugar before breakfast     Care Management & Community Referrals:  Referral 11/25/2012  Referrals made for care management support none needed  Referrals made to community resources none

## 2012-11-25 NOTE — Assessment & Plan Note (Signed)
Assessment: Patient's back pain is well controlled on tramadol 50 mg every 8 hours as needed for back pain; he reports that he is currently needing it only once or twice a day.  Plan: Continue tramadol on an as-needed basis.

## 2012-11-25 NOTE — Assessment & Plan Note (Signed)
  Total Bilirubin  Date Value Range Status  11/16/2012 0.6  0.3 - 1.2 mg/dL Final  1/61/0960 0.5  0.3 - 1.2 mg/dL Final  04/25/4096 0.3  0.3 - 1.2 mg/dL Final     Alkaline Phosphatase  Date Value Range Status  11/16/2012 60  39 - 117 U/L Final  08/19/2012 64  39 - 117 U/L Final  01/24/2012 56  39 - 117 U/L Final     AST  Date Value Range Status  11/16/2012 28  0 - 37 U/L Final  08/19/2012 38* 0 - 37 U/L Final  01/24/2012 21  0 - 37 U/L Final     ALT  Date Value Range Status  11/16/2012 37  0 - 53 U/L Final  08/19/2012 61* 0 - 53 U/L Final  01/24/2012 31  0 - 53 U/L Final     Assessment: Patient's AST and ALT were mildly elevated on labs drawn 08/19/2012; patient was asymptomatic.  Repeat AST and ALT on 11/16/2012 were normal.  Plan: No further workup planned at this time.

## 2012-12-03 ENCOUNTER — Other Ambulatory Visit: Payer: Medicaid Other

## 2012-12-03 DIAGNOSIS — Z1211 Encounter for screening for malignant neoplasm of colon: Secondary | ICD-10-CM

## 2012-12-03 LAB — POC HEMOCCULT BLD/STL (HOME/3-CARD/SCREEN): Card #2 Fecal Occult Blod, POC: NEGATIVE

## 2012-12-22 ENCOUNTER — Telehealth: Payer: Self-pay | Admitting: Dietician

## 2012-12-23 ENCOUNTER — Other Ambulatory Visit: Payer: Self-pay | Admitting: Internal Medicine

## 2012-12-23 MED ORDER — PAROXETINE HCL 20 MG PO TABS: 20.0000 mg | ORAL_TABLET | Freq: Every day | ORAL | Status: DC

## 2012-12-23 NOTE — Telephone Encounter (Signed)
I recently refilled this medication; please find out if patient submitted the prescription to his pharmacy.

## 2012-12-23 NOTE — Telephone Encounter (Signed)
Agrees to appoinmtment with [place] [facility] on a Thursday afternoon. Scheduled for  2 PM Thursday Dec 18th and notified patient of same. Asks for refill of generic paxil sent to [facility] on [address]

## 2012-12-24 NOTE — Telephone Encounter (Signed)
Pt called and he has Rx with him.  He will bring then into the pharmacy today.

## 2013-01-05 ENCOUNTER — Encounter: Payer: Self-pay | Admitting: *Deleted

## 2013-02-05 ENCOUNTER — Encounter: Payer: Medicaid Other | Admitting: Dietician

## 2013-02-09 ENCOUNTER — Encounter: Payer: Medicaid Other | Admitting: Dietician

## 2013-02-09 ENCOUNTER — Encounter: Payer: Self-pay | Admitting: Internal Medicine

## 2013-04-01 NOTE — Addendum Note (Signed)
Addended by: Yvonna Alanis E on: 04/01/2013 10:50 PM   Modules accepted: Orders

## 2013-04-07 ENCOUNTER — Ambulatory Visit: Payer: Medicaid Other | Admitting: Internal Medicine

## 2013-04-23 ENCOUNTER — Other Ambulatory Visit: Payer: Self-pay | Admitting: Internal Medicine

## 2013-04-26 NOTE — Telephone Encounter (Signed)
Refill approved - nurse to call in tramadol.

## 2013-04-27 NOTE — Telephone Encounter (Signed)
Called to pharm 

## 2013-04-29 ENCOUNTER — Other Ambulatory Visit: Payer: Self-pay

## 2013-05-24 ENCOUNTER — Other Ambulatory Visit: Payer: Self-pay | Admitting: Internal Medicine

## 2013-05-25 NOTE — Telephone Encounter (Signed)
Refill approved - nurse to call in. 

## 2013-05-25 NOTE — Telephone Encounter (Signed)
Called to pharm 

## 2013-06-23 ENCOUNTER — Other Ambulatory Visit: Payer: Self-pay | Admitting: Internal Medicine

## 2013-07-07 ENCOUNTER — Encounter: Payer: Self-pay | Admitting: Internal Medicine

## 2013-07-07 ENCOUNTER — Ambulatory Visit (INDEPENDENT_AMBULATORY_CARE_PROVIDER_SITE_OTHER): Payer: Medicaid Other | Admitting: Internal Medicine

## 2013-07-07 VITALS — BP 136/80 | HR 89 | Temp 98.0°F | Ht 68.0 in | Wt 317.4 lb

## 2013-07-07 DIAGNOSIS — E785 Hyperlipidemia, unspecified: Secondary | ICD-10-CM

## 2013-07-07 DIAGNOSIS — K219 Gastro-esophageal reflux disease without esophagitis: Secondary | ICD-10-CM

## 2013-07-07 DIAGNOSIS — F329 Major depressive disorder, single episode, unspecified: Secondary | ICD-10-CM

## 2013-07-07 DIAGNOSIS — F3289 Other specified depressive episodes: Secondary | ICD-10-CM

## 2013-07-07 DIAGNOSIS — I10 Essential (primary) hypertension: Secondary | ICD-10-CM

## 2013-07-07 DIAGNOSIS — E119 Type 2 diabetes mellitus without complications: Secondary | ICD-10-CM

## 2013-07-07 DIAGNOSIS — N400 Enlarged prostate without lower urinary tract symptoms: Secondary | ICD-10-CM

## 2013-07-07 LAB — POCT GLYCOSYLATED HEMOGLOBIN (HGB A1C): HEMOGLOBIN A1C: 6.1

## 2013-07-07 LAB — GLUCOSE, CAPILLARY: Glucose-Capillary: 129 mg/dL — ABNORMAL HIGH (ref 70–99)

## 2013-07-07 NOTE — Assessment & Plan Note (Signed)
Lipids:    Component Value Date/Time   CHOL 136 08/19/2012 1136   TRIG 87 08/19/2012 1136   HDL 50 08/19/2012 1136   LDLCALC 69 08/19/2012 1136   VLDL 17 08/19/2012 1136   CHOLHDL 2.7 08/19/2012 1136    Assessment: Patient is doing well on atorvastatin 10 mg daily, with no apparent side effects of the medication.  Plan: Check a lipid panel today; continue atorvastatin 10 mg daily pending that result.

## 2013-07-07 NOTE — Assessment & Plan Note (Signed)
BP Readings from Last 3 Encounters:  07/07/13 136/80  11/25/12 129/74  08/19/12 147/86    Lab Results  Component Value Date   NA 134* 11/16/2012   K 4.2 11/16/2012   CREATININE 1.12 11/16/2012    Assessment: Blood pressure control: controlled Progress toward BP goal:  at goal Comments: Blood pressure is well controlled on enalapril 5 mg daily and hydrochlorothiazide 25 mg daily.  Plan: Medications:  continue current medications Educational resources provided: brochure Self management tools provided: home blood pressure logbook

## 2013-07-07 NOTE — Assessment & Plan Note (Signed)
Lab Results  Component Value Date   HGBA1C 6.1 07/07/2013   HGBA1C 6.0 11/25/2012   HGBA1C 6.4 08/19/2012     Assessment: Diabetes control: good control (HgbA1C at goal) Progress toward A1C goal:  at goal Comments: Diabetes is well controlled on metformin 1000 mg twice a day with meals.  Plan: Medications:  continue current medications Home glucose monitoring: Frequency: once a day Timing: before breakfast Educational resources provided: handout Self management tools provided: copy of home glucose meter download Other plans: Check comprehensive metabolic panel today

## 2013-07-07 NOTE — Progress Notes (Signed)
   Subjective:    Patient ID: Carlos Bean, male    DOB: July 08, 1960, 53 y.o.   MRN: [id number]  HPI Patient returns for management of his diabetes mellitus, hyperlipidemia, depression, hypertension, and other chronic medical problems.  Today he is doing well, without any acute complaints.  He has changed his urology care to a urologist Dr. Estill Dooms with [facility] in [facility], and he saw Dr. Estill Dooms in May.  Dr. Estill Dooms increased his tamsulosin from one tablet daily to two capsules daily, and patient reports significant improvement in his symptoms of nocturia and urgency following that change.  Patient reports that he is compliant with his medications.  He brought his medication bottles to clinic for review today.  He reports significant improvement in his chronic back pain on tramadol, which he takes on average about twice a day.  His impression is well-controlled on paroxetine, and he denies any recent depressive symptoms.  He has no indigestion or reflux symptoms on his current regimen of omeprazole and ranitidine 150 mg at bedtime.  Medication list and allergies were reviewed and updated.  Past medical history, past surgical history, family history, and social history were reviewed.  Review of Systems  Constitutional: Negative for fever, chills and diaphoresis.  Respiratory: Negative for shortness of breath and wheezing.   Cardiovascular: Negative for chest pain and leg swelling.  Gastrointestinal: Negative for nausea, vomiting and abdominal pain.  Genitourinary: Negative for frequency.  Musculoskeletal: Positive for back pain (Controlled on tramadol as needed).       Objective:   Physical Exam  Constitutional: No distress.  Cardiovascular: Normal rate, regular rhythm and normal heart sounds.  Exam reveals no gallop and no friction rub.   No murmur heard. Pulmonary/Chest: Effort normal and breath sounds normal. No respiratory distress. He has no wheezes. He has no rales.    Abdominal: Soft. Bowel sounds are normal. He exhibits no distension. There is no tenderness. There is no rebound and no guarding.  Musculoskeletal: He exhibits no edema.        Assessment & Plan:

## 2013-07-07 NOTE — Assessment & Plan Note (Signed)
Assessment: Patient reports good control of his reflux symptoms following the addition of ranitidine to his omeprazole.  Plan: Continue omeprazole 20 mg twice a day and ranitidine 150 mg at bedtime.

## 2013-07-07 NOTE — Assessment & Plan Note (Signed)
Assessment: Patient is doing well on paroxetine 20 mg daily, with no symptoms of depression.  Plan: Continue paroxetine 20 mg daily.

## 2013-07-07 NOTE — Assessment & Plan Note (Signed)
Assessment: Patient is now followed by a urologist Dr. Estill Dooms in [facility].  He reports improvement in his symptoms of nocturia and urgency after his tamsulosin dose was increased to two capsules daily  Plan: Continue tamsulosin 0.4 mg two capsules daily; followup with Dr. Estill Dooms as planned.

## 2013-07-07 NOTE — Patient Instructions (Signed)
General Instructions: Continue current medications. Call and schedule appointment for screening colonoscopy.   Progress Toward Treatment Goals:  Treatment Goal 07/07/2013  Hemoglobin A1C at goal  Blood pressure at goal  Prevent falls improved    Self Care Goals & Plans:  Self Care Goal 07/07/2013  Manage my medications take my medicines as prescribed; bring my medications to every visit; refill my medications on time  Monitor my health keep track of my blood glucose  Eat healthy foods eat baked foods instead of fried foods; eat foods that are low in salt; drink diet soda or water instead of juice or soda  Be physically active take a walk every day  Meeting treatment goals maintain the current self-care plan    Home Blood Glucose Monitoring 07/07/2013  Check my blood sugar once a day  When to check my blood sugar before breakfast     Care Management & Community Referrals:  Referral 07/07/2013  Referrals made for care management support none needed  Referrals made to community resources none

## 2013-07-08 LAB — COMPLETE METABOLIC PANEL WITH GFR
ALT: 28 U/L (ref 0–53)
AST: 20 U/L (ref 0–37)
Albumin: 4.4 g/dL (ref 3.5–5.2)
Alkaline Phosphatase: 69 U/L (ref 39–117)
BILIRUBIN TOTAL: 0.5 mg/dL (ref 0.2–1.2)
BUN: 10 mg/dL (ref 6–23)
CO2: 24 mEq/L (ref 19–32)
Calcium: 10 mg/dL (ref 8.4–10.5)
Chloride: 97 mEq/L (ref 96–112)
Creat: 1.1 mg/dL (ref 0.50–1.35)
GFR, EST NON AFRICAN AMERICAN: 76 mL/min
GFR, Est African American: 88 mL/min
GLUCOSE: 117 mg/dL — AB (ref 70–99)
Potassium: 4.6 mEq/L (ref 3.5–5.3)
SODIUM: 136 meq/L (ref 135–145)
TOTAL PROTEIN: 7.5 g/dL (ref 6.0–8.3)

## 2013-07-08 LAB — LIPID PANEL
CHOL/HDL RATIO: 2.4 ratio
Cholesterol: 150 mg/dL (ref 0–200)
HDL: 63 mg/dL (ref >39)
LDL Cholesterol: 64 mg/dL (ref 0–99)
Triglycerides: 117 mg/dL (ref <150)
VLDL: 23 mg/dL (ref 0–40)

## 2013-08-22 ENCOUNTER — Other Ambulatory Visit: Payer: Self-pay | Admitting: Internal Medicine

## 2013-08-26 ENCOUNTER — Other Ambulatory Visit: Payer: Self-pay | Admitting: Internal Medicine

## 2013-09-28 ENCOUNTER — Other Ambulatory Visit: Payer: Self-pay | Admitting: Internal Medicine

## 2013-11-11 ENCOUNTER — Other Ambulatory Visit: Payer: Self-pay | Admitting: Internal Medicine

## 2013-11-11 NOTE — Telephone Encounter (Signed)
Refills approved - nurse to call in the tramadol refill.

## 2013-11-17 ENCOUNTER — Other Ambulatory Visit: Payer: Self-pay | Admitting: Internal Medicine

## 2013-11-17 NOTE — Telephone Encounter (Signed)
Rx called in to pharmacy. Hilda Blades Tarena Gockley RN 11/17/13 1:50PM

## 2013-11-17 NOTE — Telephone Encounter (Signed)
I approved this refill on 10/22 with 2 additional refills.  Please find out why the pharmacy is requesting again.

## 2013-11-23 ENCOUNTER — Encounter: Payer: Self-pay | Admitting: Internal Medicine

## 2013-11-23 ENCOUNTER — Ambulatory Visit (INDEPENDENT_AMBULATORY_CARE_PROVIDER_SITE_OTHER): Payer: Medicaid Other | Admitting: Internal Medicine

## 2013-11-23 ENCOUNTER — Ambulatory Visit: Payer: Medicaid Other | Admitting: *Deleted

## 2013-11-23 VITALS — BP 136/72 | HR 81 | Temp 98.5°F | Ht 68.0 in | Wt 324.1 lb

## 2013-11-23 DIAGNOSIS — I10 Essential (primary) hypertension: Secondary | ICD-10-CM

## 2013-11-23 DIAGNOSIS — Z23 Encounter for immunization: Secondary | ICD-10-CM

## 2013-11-23 DIAGNOSIS — K219 Gastro-esophageal reflux disease without esophagitis: Secondary | ICD-10-CM

## 2013-11-23 DIAGNOSIS — M545 Low back pain: Secondary | ICD-10-CM

## 2013-11-23 DIAGNOSIS — E785 Hyperlipidemia, unspecified: Secondary | ICD-10-CM

## 2013-11-23 DIAGNOSIS — M5440 Lumbago with sciatica, unspecified side: Secondary | ICD-10-CM

## 2013-11-23 DIAGNOSIS — E119 Type 2 diabetes mellitus without complications: Secondary | ICD-10-CM

## 2013-11-23 DIAGNOSIS — F329 Major depressive disorder, single episode, unspecified: Secondary | ICD-10-CM

## 2013-11-23 DIAGNOSIS — K921 Melena: Secondary | ICD-10-CM

## 2013-11-23 DIAGNOSIS — F32A Depression, unspecified: Secondary | ICD-10-CM

## 2013-11-23 DIAGNOSIS — N400 Enlarged prostate without lower urinary tract symptoms: Secondary | ICD-10-CM

## 2013-11-23 LAB — CBC WITH DIFFERENTIAL/PLATELET
BASOS PCT: 0 % (ref 0–1)
Basophils Absolute: 0 10*3/uL (ref 0.0–0.1)
Eosinophils Absolute: 0.1 10*3/uL (ref 0.0–0.7)
Eosinophils Relative: 2 % (ref 0–5)
HCT: 40.5 % (ref 39.0–52.0)
Hemoglobin: 13.2 g/dL (ref 13.0–17.0)
LYMPHS ABS: 3 10*3/uL (ref 0.7–4.0)
Lymphocytes Relative: 41 % (ref 12–46)
MCH: 28.7 pg (ref 26.0–34.0)
MCHC: 32.6 g/dL (ref 30.0–36.0)
MCV: 88 fL (ref 78.0–100.0)
Monocytes Absolute: 0.6 10*3/uL (ref 0.1–1.0)
Monocytes Relative: 8 % (ref 3–12)
NEUTROS PCT: 49 % (ref 43–77)
Neutro Abs: 3.6 10*3/uL (ref 1.7–7.7)
PLATELETS: 370 10*3/uL (ref 150–400)
RBC: 4.6 MIL/uL (ref 4.22–5.81)
RDW: 13.4 % (ref 11.5–15.5)
WBC: 7.3 10*3/uL (ref 4.0–10.5)

## 2013-11-23 LAB — GLUCOSE, CAPILLARY: Glucose-Capillary: 115 mg/dL — ABNORMAL HIGH (ref 70–99)

## 2013-11-23 NOTE — Assessment & Plan Note (Signed)
Assessment: Patient reports small amounts of bright red blood per rectum noted on the toilet paper when he is wiping after a bowel movement.  Plan: I have referred patient in the past for screening colonoscopy, but he did not keep those appointments despite my recommendations that he do so.  He now says that he is willing to keep the appointment and undergo a lower GI workup.  The plan is to check a CBC with differential today, and refer again for colonoscopy given his hematochezia.  I advised him to call or return immediately if he has any increase in the quantity of blood or other concerning symptoms.

## 2013-11-23 NOTE — Assessment & Plan Note (Signed)
Assessment: Patient is followed by urologist Dr. Denyce Robert in [facility], and is scheduled for TURP next week by Dr. Estill Dooms.  Plan: Continue tamsulosin 0.4 mg two capsules daily and finasteride (Proscar) 5 mg daily as prescribed by Dr. Estill Dooms; followup with Dr. Estill Dooms as planned.

## 2013-11-23 NOTE — Assessment & Plan Note (Signed)
Assessment: Symptoms are well controlled on paroxetine 20 mg daily.  Plan: Continue paroxetine 20 mg daily.

## 2013-11-23 NOTE — Assessment & Plan Note (Signed)
Assessment: Patient is doing well without symptoms on omeprazole 20 mg twice a day and ranitidine 150 mg daily at bedtime.  Plan: Continue current medications.

## 2013-11-23 NOTE — Assessment & Plan Note (Addendum)
Assessment: Patient has stable chronic low back pain which is well controlled on tramadol 50 mg every 8 hours as needed.  Plan: Continue tramadol 50 mg every 8 hours as needed. 

## 2013-11-23 NOTE — Assessment & Plan Note (Signed)
Lab Results  Component Value Date   HGBA1C 6.1 07/07/2013   HGBA1C 6.0 11/25/2012   HGBA1C 6.4 08/19/2012     Assessment: Diabetes control: good control (HgbA1C at goal) Progress toward A1C goal:  at goal Comments: well-controlled on metformin 1000 mg twice a day  Plan: Medications:  continue current medications Home glucose monitoring: Frequency: once a day Timing: before breakfast

## 2013-11-23 NOTE — Assessment & Plan Note (Signed)
BP Readings from Last 3 Encounters:  11/23/13 136/72  07/07/13 136/80  11/25/12 129/74    Lab Results  Component Value Date   NA 136 07/07/2013   K 4.6 07/07/2013   CREATININE 1.10 07/07/2013    Assessment: Blood pressure control: controlled Progress toward BP goal:  at goal Comments: blood pressure is well controlled on enalapril 5 mg daily and hydrochlorothiazide 25 mg daily.  Plan: Medications:  continue current medications

## 2013-11-23 NOTE — Patient Instructions (Signed)
Continue current medications. A referral has been made to a gastroenterologist for colonoscopy in order to workup small amounts of blood per rectum.  You should call or return immediately if there is any increase in the amount of blood seen, or other new symptoms.

## 2013-11-23 NOTE — Progress Notes (Signed)
   Subjective:    Patient ID: Carlos Bean, male    DOB: 30-Sep-1960, 53 y.o.   MRN: [id number]  HPI  Patient returns for follow-up management of his diabetes mellitus, hypertension, GERD, and other chronic medical problems.  His main complaints recently have been frequency and urgency related to his BPH; this is being managed by  [facility] urologist Dr. Denyce Robert  in [facility].  Patient reports that he is scheduled for a TURP next week.  He also had 3 teeth extracted last week, and is currently on a short course of oral penicillin and hydrocodone-acetaminophen as needed for pain; he is also using chlorhexidine oral rinse prescribed by his dentist.  Patient also reports recently seeing bright red blood per rectum in small amounts on the toilet paper when he wipes.  He has not checked his blood sugar frequently in the past month; his 7 measurements show an average value of 123.  He reports that he is compliant with his medications.  He reports that his depression is well controlled on his current dose of paroxetine, with no recent symptoms.  His GERD is well controlled on ranitidine.   Review of Systems  Constitutional: Negative for fever, chills and diaphoresis.  Respiratory: Negative for cough and shortness of breath.   Cardiovascular: Negative for chest pain and leg swelling.  Gastrointestinal: Positive for blood in stool. Negative for nausea, vomiting and abdominal pain.  Genitourinary: Positive for urgency and frequency.   I reviewed and updated the medications, allergies, past medical history, past surgical history, family history, and social history.     Objective:   Physical Exam  Constitutional: No distress.  Cardiovascular: Normal rate, regular rhythm and normal heart sounds.  Exam reveals no gallop and no friction rub.   No murmur heard. Pulmonary/Chest: Effort normal and breath sounds normal. No respiratory distress. He has no wheezes. He has no rales.  Abdominal: Soft. Bowel  sounds are normal. He exhibits no distension. There is no tenderness. There is no rebound and no guarding.  Musculoskeletal: He exhibits no edema.        Assessment & Plan:

## 2013-11-23 NOTE — Assessment & Plan Note (Signed)
Lipids:    Component Value Date/Time   CHOL 150 07/07/2013 1049   TRIG 117 07/07/2013 1049   HDL 63 07/07/2013 1049   LDLCALC 64 07/07/2013 1049   VLDL 23 07/07/2013 1049   CHOLHDL 2.4 07/07/2013 1049    Assessment: LDL is at goal on atorvastatin 10 mg daily.  Patient reports no apparent side effects.  Plan: Continue atorvastatin 10 mg daily

## 2013-11-24 LAB — POCT GLYCOSYLATED HEMOGLOBIN (HGB A1C): HEMOGLOBIN A1C: 6.7

## 2013-12-12 ENCOUNTER — Other Ambulatory Visit: Payer: Self-pay | Admitting: Internal Medicine

## 2013-12-20 ENCOUNTER — Encounter: Payer: Self-pay | Admitting: *Deleted

## 2013-12-27 ENCOUNTER — Encounter: Payer: Self-pay | Admitting: Internal Medicine

## 2014-01-17 ENCOUNTER — Other Ambulatory Visit: Payer: Self-pay | Admitting: Internal Medicine

## 2014-01-17 NOTE — Telephone Encounter (Signed)
Refills approved - nurse to call in the tramadol prescription.

## 2014-01-19 NOTE — Telephone Encounter (Signed)
Called to pharm 

## 2014-02-16 ENCOUNTER — Encounter: Payer: Self-pay | Admitting: Internal Medicine

## 2014-02-16 ENCOUNTER — Other Ambulatory Visit: Payer: Self-pay | Admitting: Internal Medicine

## 2014-02-16 ENCOUNTER — Ambulatory Visit (INDEPENDENT_AMBULATORY_CARE_PROVIDER_SITE_OTHER): Payer: Medicaid Other | Admitting: Internal Medicine

## 2014-02-16 VITALS — BP 124/82 | HR 74 | Ht 68.0 in | Wt 318.0 lb

## 2014-02-16 DIAGNOSIS — K5900 Constipation, unspecified: Secondary | ICD-10-CM

## 2014-02-16 DIAGNOSIS — K625 Hemorrhage of anus and rectum: Secondary | ICD-10-CM

## 2014-02-16 DIAGNOSIS — Z1211 Encounter for screening for malignant neoplasm of colon: Secondary | ICD-10-CM

## 2014-02-16 DIAGNOSIS — K219 Gastro-esophageal reflux disease without esophagitis: Secondary | ICD-10-CM

## 2014-02-16 MED ORDER — MOVIPREP 100 G PO SOLR: 1.0000 | Freq: Once | ORAL | Status: DC

## 2014-02-16 NOTE — Patient Instructions (Signed)

## 2014-02-16 NOTE — Progress Notes (Signed)
HISTORY OF PRESENT ILLNESS:  Carlos Bean is a 54 y.o. male with multiple significant medical problems as listed below. Electronic medical record, relevant laboratories and x-rays reviewed. Patient is accompanied today by his wife. He is sent today regarding change in bowel habits, minor rectal bleeding, and the need for colonoscopy. The patient denies any prior GI evaluation or GI procedures. He does report significant chronic constipation for which he has tried different agents including Colace and MiraLAX without improvement. He does take occasional magnesium citrate, which helps. He only has diarrhea if he takes laxatives. He does notice minor rectal bleeding, mostly on the tissue. He does have a paternal aunt with history of colon cancer. The patient has had no weight loss. He does have occasional bloating. He also has a history of chronic reflux for which he takes omeprazole 20 mg daily and ranitidine 150 mg at night with good control of symptoms. No dysphagia. His chronic medical problems are stable.  REVIEW OF SYSTEMS:  All non-GI ROS negative except for sinus and allergy trouble, arthritis, back pain, visual change, depression, shortness of breath, night sweats, excessive urination, urinary leakage.  Past Medical History  Diagnosis Date  . Diabetes mellitus, type 2   . Hypertension   . Hyperlipemia   . GERD (gastroesophageal reflux disease)   . Depression   . Chronic low back pain   . Obesity   . BPH (benign prostatic hypertrophy)   . Hematochezia     Referred to GI 05/17/2009, missed appointment.  . Elevated transaminase level     Mildly elevated SGPT  . Allergic rhinitis   . Constipation   . Carpal tunnel syndrome     S/P release transverse carpal ligament right wrist and decompression  median nerve right wrist by Dr. Geroge Baseman. Mortenson 02/25/2002.  Marland Kitchen Dyspnea on exertion     Spirometry normal 02/26/2001  . Anemia, unspecified     Mild normocytic anemia; Hgb=12.6 on  07/03/2005  . Atypical chest pain     Evaluated by Dr. Fransico Him in 2002; adenosine cardiolite was negative then.  . Genital herpes   . Irregular heart beat   . Unspecified disorder of male genital organs     Patient reports occasional bleeding in small amount from area of skin irritation on scrotum, none recent; exam 04/26/2009 normal.  . Tonsillitis 10/07/2008  . Bronchitis   . Sinusitis 06/2011    Past Surgical History  Procedure Laterality Date  . Carpal tunnel release  02/25/2002    S/P release transverse carpal ligament right wrist and decompression  median nerve right wrist by Dr. Geroge Baseman. Mortenson 02/25/2002.    Social History [name]  reports that he quit smoking about 24 years ago. He has never used smokeless tobacco. He reports that he does not drink alcohol or use illicit drugs.  family history includes Breast cancer in his sister; Colon cancer (age of onset: 2) in his paternal aunt; Diabetes type II in his mother; Kidney failure in his mother; Prostate cancer (age of onset: 83) in his brother; Stroke in his father.  Allergies  Allergen Reactions  . Clarithromycin Nausea And Vomiting    REACTION: Nausea.       PHYSICAL EXAMINATION: Vital signs: BP 124/82 mmHg  Pulse 74  Ht 5\' 8"  (1.727 m)  Wt 318 lb (144.244 kg)  BMI 48.36 kg/m2  Constitutional: Obese, generally well-appearing, no acute distress Psychiatric: alert and oriented x3, cooperative Eyes: extraocular movements intact, anicteric, conjunctiva pink  Mouth: oral pharynx moist, no lesions Neck: supple no lymphadenopathy Cardiovascular: heart regular rate and rhythm, no murmur Lungs: clear to auscultation bilaterally Abdomen: soft, obese, nontender, nondistended, no obvious ascites, no peritoneal signs, normal bowel sounds, no organomegaly Rectal: Deferred until colonoscopy Extremities: no lower extremity edema bilaterally Skin: no lesions on visible extremities Neuro: No focal deficits.    ASSESSMENT:  #1. Chronic constipation. #2. Minor intermittent rectal bleeding #3. Chronic GERD. Controlled with once daily PPI and at night ranitidine #4. Colon cancer screening. Appropriate candidate without contraindication #5. Morbid obesity #6. Multiple medical problems including diabetes for which he is on medication     PLAN:  #1. Increase dietary fiber #2. Can try MiraLAX at higher dose. Previously taking 1 dose daily for week or so. Titrate to need #3. Colonoscopy. The patient is high-risk given his body habitus and comorbidities.The nature of the procedure, as well as the risks, benefits, and alternatives were carefully and thoroughly reviewed with the patient. Ample time for discussion and questions allowed. The patient understood, was satisfied, and agreed to proceed. #4. More extensive prep given constipation issues. Recommend 1 bottle of magnesium citrate the morning prior to the procedure followed by standard Movi prep #5. Hold diabetic medications the day of the procedure until procedure complete to avoid hypoglycemia #6. Reflux precautions with significant attention to weight loss #7. Continue current antireflux regimen #8. Ongoing general medical care with Dr. Marinda Elk

## 2014-02-17 NOTE — Telephone Encounter (Signed)
Refill approved - nurse to call in the tramadol refill.

## 2014-02-21 NOTE — Telephone Encounter (Signed)
Called to pharm 

## 2014-03-14 ENCOUNTER — Ambulatory Visit (AMBULATORY_SURGERY_CENTER): Payer: Medicaid Other | Admitting: Internal Medicine

## 2014-03-14 ENCOUNTER — Encounter: Payer: Self-pay | Admitting: Internal Medicine

## 2014-03-14 VITALS — BP 148/63 | HR 74 | Temp 97.2°F | Resp 15 | Ht 68.0 in | Wt 318.0 lb

## 2014-03-14 DIAGNOSIS — K5900 Constipation, unspecified: Secondary | ICD-10-CM

## 2014-03-14 DIAGNOSIS — D123 Benign neoplasm of transverse colon: Secondary | ICD-10-CM

## 2014-03-14 DIAGNOSIS — Z1211 Encounter for screening for malignant neoplasm of colon: Secondary | ICD-10-CM

## 2014-03-14 DIAGNOSIS — D120 Benign neoplasm of cecum: Secondary | ICD-10-CM

## 2014-03-14 LAB — GLUCOSE, CAPILLARY: Glucose-Capillary: 111 mg/dL — ABNORMAL HIGH (ref 70–99)

## 2014-03-14 MED ORDER — SODIUM CHLORIDE 0.9 % IV SOLN: 500.0000 mL | INTRAVENOUS | Status: DC

## 2014-03-14 NOTE — Op Note (Signed)
[place]  [address] [place], [id number]   COLONOSCOPY PROCEDURE REPORT  PATIENT: Carlos Bean, Carlos Bean  MR#: [id number] BIRTHDATE: 07/05/1960 , 28  yrs. old GENDER: male ENDOSCOPIST: Eustace Quail, MD REFERRED QM:GNOIB Ky Barban, M.D. PROCEDURE DATE:  03/14/2014 PROCEDURE:   Colonoscopy with snare polypectomy x 2 First Screening Colonoscopy - Avg.  risk and is 50 yrs.  old or older Yes.  Prior Negative Screening - Now for repeat screening. N/A  History of Adenoma - Now for follow-up colonoscopy & has been > or = to 3 yrs.  N/A  Polyps Removed Today? Yes. ASA CLASS:   Class III INDICATIONS:average risk patient for colorectal cancer. MEDICATIONS: Monitored anesthesia care and Propofol 300 mg IV  DESCRIPTION OF PROCEDURE:   After the risks benefits and alternatives of the procedure were thoroughly explained, informed consent was obtained.  The digital rectal exam revealed no abnormalities of the rectum.   The [id number]  endoscope was introduced through the anus and advanced to the cecum, which was identified by both the appendix and ileocecal valve. No adverse events experienced.   The quality of the prep was good, using MoviPrep  The instrument was then slowly withdrawn as the colon was fully examined.   COLON FINDINGS: Two polyps measuring 5 mm in size were found in the transverse colon and at the cecum.  A polypectomy was performed with a cold snare.  The resection was complete, the polyp tissue was completely retrieved and sent to histology.   There was mild diverticulosis noted in the sigmoid colon.   The examination was otherwise normal.  Retroflexed views revealed internal hemorrhoids. The time to cecum=2 minutes 33 seconds.  Withdrawal time=11 minutes 54 seconds.  The scope was withdrawn and the procedure completed. COMPLICATIONS: There were no immediate complications.  ENDOSCOPIC IMPRESSION: 1.   Two polyps were found in the transverse  colon and at the cecum; polypectomy was performed with a cold snare 2.   Mild diverticulosis was noted in the sigmoid colon 3.   The examination was otherwise normal  RECOMMENDATIONS: 1. Repeat colonoscopy in 5 years if polyp adenomatous; otherwise 10 years  eSigned:  Eustace Quail, MD 03/14/2014 2:56 PM   cc: Bertha Stakes, MD and The Patient

## 2014-03-14 NOTE — Progress Notes (Signed)
Report to PACU, RN, vss, BBS= Clear.  

## 2014-03-14 NOTE — Patient Instructions (Signed)
YOU HAD AN ENDOSCOPIC PROCEDURE TODAY AT THE [facility]: Refer to the procedure report that was given to you for any specific questions about what was found during the examination.  If the procedure report does not answer your questions, please call your gastroenterologist to clarify.  If you requested that your care partner not be given the details of your procedure findings, then the procedure report has been included in a sealed envelope for you to review at your convenience later.  YOU SHOULD EXPECT: Some feelings of bloating in the abdomen. Passage of more gas than usual.  Walking can help get rid of the air that was put into your GI tract during the procedure and reduce the bloating. If you had a lower endoscopy (such as a colonoscopy or flexible sigmoidoscopy) you may notice spotting of blood in your stool or on the toilet paper. If you underwent a bowel prep for your procedure, then you may not have a normal bowel movement for a few days.  DIET: Your first meal following the procedure should be a light meal and then it is ok to progress to your normal diet.  A half-sandwich or bowl of soup is an example of a good first meal.  Heavy or fried foods are harder to digest and may make you feel nauseous or bloated.  Likewise meals heavy in dairy and vegetables can cause extra gas to form and this can also increase the bloating.  Drink plenty of fluids but you should avoid alcoholic beverages for 24 hours.  ACTIVITY: Your care partner should take you home directly after the procedure.  You should plan to take it easy, moving slowly for the rest of the day.  You can resume normal activity the day after the procedure however you should NOT DRIVE or use heavy machinery for 24 hours (because of the sedation medicines used during the test).    SYMPTOMS TO REPORT IMMEDIATELY: A gastroenterologist can be reached at any hour.  During normal business hours, 8:30 AM to 5:00 PM Monday through Friday,  call [phone].  After hours and on weekends, please call the GI answering service at [phone] who will take a message and have the physician on call contact you.   Following lower endoscopy (colonoscopy or flexible sigmoidoscopy):  Excessive amounts of blood in the stool  Significant tenderness or worsening of abdominal pains  Swelling of the abdomen that is new, acute  Fever of 100F or higher  FOLLOW UP: If any biopsies were taken you will be contacted by phone or by letter within the next 1-3 weeks.  Call your gastroenterologist if you have not heard about the biopsies in 3 weeks.  Our staff will call the home number listed on your records the next business day following your procedure to check on you and address any questions or concerns that you may have at that time regarding the information given to you following your procedure. This is a courtesy call and so if there is no answer at the home number and we have not heard from you through the emergency physician on call, we will assume that you have returned to your regular daily activities without incident.  SIGNATURES/CONFIDENTIALITY: You and/or your care partner have signed paperwork which will be entered into your electronic medical record.  These signatures attest to the fact that that the information above on your After Visit Summary has been reviewed and is understood.  Full responsibility of the confidentiality of this   discharge information lies with you and/or your care-partner.  Polyps, Diverticulosis, high fiber diet-handouts given

## 2014-03-14 NOTE — Progress Notes (Signed)
Made CRNA aware that pt. Ate sausage dog at 0700 this a.m.

## 2014-03-14 NOTE — Progress Notes (Signed)
Called to room to assist during endoscopic procedure.  Patient ID and intended procedure confirmed with present staff. Received instructions for my participation in the procedure from the performing physician.  

## 2014-03-15 ENCOUNTER — Telehealth: Payer: Self-pay

## 2014-03-15 LAB — GLUCOSE, CAPILLARY: GLUCOSE-CAPILLARY: 124 mg/dL — AB (ref 70–99)

## 2014-03-15 NOTE — Telephone Encounter (Signed)
  Follow up Call-  Call back number 03/14/2014  Post procedure Call Back phone  # [phone]  Permission to leave phone message Yes     Patient questions:  Do you have a fever, pain , or abdominal swelling? No. Pain Score  0 *  Have you tolerated food without any problems? Yes.    Have you been able to return to your normal activities? Yes.    Do you have any questions about your discharge instructions: Diet   No. Medications  No. Follow up visit  No.  Do you have questions or concerns about your Care? No.  Actions: * If pain score is 4 or above: No action needed, pain <4.

## 2014-03-22 ENCOUNTER — Encounter: Payer: Self-pay | Admitting: Internal Medicine

## 2014-03-23 ENCOUNTER — Encounter: Payer: Self-pay | Admitting: Internal Medicine

## 2014-03-23 DIAGNOSIS — D126 Benign neoplasm of colon, unspecified: Secondary | ICD-10-CM

## 2014-03-23 HISTORY — DX: Benign neoplasm of colon, unspecified: D12.6

## 2014-03-25 ENCOUNTER — Encounter: Payer: Self-pay | Admitting: Internal Medicine

## 2014-03-25 DIAGNOSIS — K5730 Diverticulosis of large intestine without perforation or abscess without bleeding: Secondary | ICD-10-CM

## 2014-03-25 HISTORY — DX: Diverticulosis of large intestine without perforation or abscess without bleeding: K57.30

## 2014-04-04 ENCOUNTER — Telehealth: Payer: Self-pay | Admitting: Internal Medicine

## 2014-04-04 ENCOUNTER — Other Ambulatory Visit: Payer: Self-pay | Admitting: Internal Medicine

## 2014-04-04 NOTE — Telephone Encounter (Signed)
Call to patient to confirm appointment for 04/05/14 at 9:15 both nuimbers are disconnected

## 2014-04-05 ENCOUNTER — Ambulatory Visit (INDEPENDENT_AMBULATORY_CARE_PROVIDER_SITE_OTHER): Payer: Medicaid Other | Admitting: Internal Medicine

## 2014-04-05 ENCOUNTER — Telehealth: Payer: Self-pay | Admitting: Dietician

## 2014-04-05 ENCOUNTER — Encounter: Payer: Self-pay | Admitting: Internal Medicine

## 2014-04-05 VITALS — BP 146/81 | HR 70 | Temp 97.9°F | Wt 317.6 lb

## 2014-04-05 DIAGNOSIS — N400 Enlarged prostate without lower urinary tract symptoms: Secondary | ICD-10-CM

## 2014-04-05 DIAGNOSIS — I10 Essential (primary) hypertension: Secondary | ICD-10-CM | POA: Diagnosis not present

## 2014-04-05 DIAGNOSIS — K921 Melena: Secondary | ICD-10-CM

## 2014-04-05 DIAGNOSIS — G8929 Other chronic pain: Secondary | ICD-10-CM | POA: Diagnosis not present

## 2014-04-05 DIAGNOSIS — K219 Gastro-esophageal reflux disease without esophagitis: Secondary | ICD-10-CM | POA: Diagnosis not present

## 2014-04-05 DIAGNOSIS — M545 Low back pain: Secondary | ICD-10-CM

## 2014-04-05 DIAGNOSIS — E119 Type 2 diabetes mellitus without complications: Secondary | ICD-10-CM

## 2014-04-05 DIAGNOSIS — F32A Depression, unspecified: Secondary | ICD-10-CM

## 2014-04-05 DIAGNOSIS — Z23 Encounter for immunization: Secondary | ICD-10-CM | POA: Diagnosis not present

## 2014-04-05 DIAGNOSIS — E785 Hyperlipidemia, unspecified: Secondary | ICD-10-CM

## 2014-04-05 DIAGNOSIS — F329 Major depressive disorder, single episode, unspecified: Secondary | ICD-10-CM

## 2014-04-05 LAB — CBC WITH DIFFERENTIAL/PLATELET
BASOS ABS: 0 10*3/uL (ref 0.0–0.1)
BASOS PCT: 0 % (ref 0–1)
Eosinophils Absolute: 0.1 10*3/uL (ref 0.0–0.7)
Eosinophils Relative: 2 % (ref 0–5)
HCT: 39.2 % (ref 39.0–52.0)
Hemoglobin: 12.9 g/dL — ABNORMAL LOW (ref 13.0–17.0)
Lymphocytes Relative: 45 % (ref 12–46)
Lymphs Abs: 2.7 10*3/uL (ref 0.7–4.0)
MCH: 29.8 pg (ref 26.0–34.0)
MCHC: 32.9 g/dL (ref 30.0–36.0)
MCV: 90.5 fL (ref 78.0–100.0)
MONOS PCT: 9 % (ref 3–12)
MPV: 8.6 fL (ref 8.6–12.4)
Monocytes Absolute: 0.5 10*3/uL (ref 0.1–1.0)
NEUTROS ABS: 2.6 10*3/uL (ref 1.7–7.7)
NEUTROS PCT: 44 % (ref 43–77)
Platelets: 317 10*3/uL (ref 150–400)
RBC: 4.33 MIL/uL (ref 4.22–5.81)
RDW: 13.3 % (ref 11.5–15.5)
WBC: 6 10*3/uL (ref 4.0–10.5)

## 2014-04-05 LAB — LIPID PANEL
CHOLESTEROL: 154 mg/dL (ref 0–200)
HDL: 53 mg/dL (ref >=40)
LDL Cholesterol: 81 mg/dL (ref 0–99)
TRIGLYCERIDES: 101 mg/dL (ref <150)
Total CHOL/HDL Ratio: 2.9 Ratio
VLDL: 20 mg/dL (ref 0–40)

## 2014-04-05 LAB — COMPLETE METABOLIC PANEL WITH GFR
ALK PHOS: 60 U/L (ref 39–117)
ALT: 33 U/L (ref 0–53)
AST: 27 U/L (ref 0–37)
Albumin: 4.1 g/dL (ref 3.5–5.2)
BILIRUBIN TOTAL: 0.5 mg/dL (ref 0.2–1.2)
BUN: 9 mg/dL (ref 6–23)
CO2: 25 mEq/L (ref 19–32)
Calcium: 9.3 mg/dL (ref 8.4–10.5)
Chloride: 102 mEq/L (ref 96–112)
Creat: 0.99 mg/dL (ref 0.50–1.35)
GFR, Est Non African American: 87 mL/min
GLUCOSE: 103 mg/dL — AB (ref 70–99)
POTASSIUM: 4.3 meq/L (ref 3.5–5.3)
SODIUM: 137 meq/L (ref 135–145)
Total Protein: 7.2 g/dL (ref 6.0–8.3)

## 2014-04-05 LAB — POCT GLYCOSYLATED HEMOGLOBIN (HGB A1C): Hemoglobin A1C: 6.8

## 2014-04-05 LAB — GLUCOSE, CAPILLARY: GLUCOSE-CAPILLARY: 119 mg/dL — AB (ref 70–99)

## 2014-04-05 NOTE — Assessment & Plan Note (Signed)
Assessment: Patient is followed by urologist Dr. Denyce Robert in [facility].  He canceled a TURP that had been previously scheduled by Dr. Estill Dooms.  Plan: Continue tamsulosin 0.4 mg two capsules daily and finasteride (Proscar) 5 mg daily as prescribed by Dr. Estill Dooms; I advised patient to schedule a follow-up appointment with Dr. Estill Dooms.

## 2014-04-05 NOTE — Assessment & Plan Note (Signed)
Lab Results  Component Value Date   HGBA1C 6.8 04/05/2014   HGBA1C 6.7 11/23/2013   HGBA1C 6.1 07/07/2013     Assessment: Diabetes control: good control (HgbA1C at goal) Progress toward A1C goal:  at goal Comments: The patient's diabetes is well controlled on metformin 1000 mg twice a day.  Plan: Medications:  continue current medications Home glucose monitoring: Frequency: once a day Timing: before breakfast Instruction/counseling given: reminded to get eye exam and reminded to bring blood glucose meter & log to each visit Other plans: I advised patient to schedule his eye exam, which is overdue.

## 2014-04-05 NOTE — Assessment & Plan Note (Signed)
Assessment: Patient reports that his symptoms are well controlled on paroxetine 20 mg daily.  Plan: Continue paroxetine 20 mg daily.

## 2014-04-05 NOTE — Progress Notes (Signed)
   Subjective:    Patient ID: Carlos Bean, male    DOB: 1960-06-24, 54 y.o.   MRN: [id number]  HPI Patient returns for management of his diabetes mellitus, hypertension, hyperlipidemia, and other chronic medical problems.  He has no acute complaints today.  He has been out of several medications recently, including his antihypertensive medication.  He did not bring his blood glucose meter to clinic today.  He reports that his back pain is well controlled on tramadol.  His urologist in [facility], Dr. Denyce Robert, had apparently planned to do a greenlight laser prostatectomy following an October office visit there; the patient reports that he canceled the procedure and has not yet followed up with his urologist.  He reports no symptoms of depression so long as he takes his paroxetine.   Review of Systems  Constitutional: Negative for fever, chills and diaphoresis.  Respiratory: Negative for shortness of breath.   Cardiovascular: Negative for chest pain and leg swelling.  Gastrointestinal: Negative for nausea, vomiting, abdominal pain, blood in stool and anal bleeding.  Genitourinary: Positive for frequency (Nocturia). Negative for dysuria.  Musculoskeletal: Positive for back pain (Chronic; controlled on tramadol.).  Neurological: Negative for dizziness, syncope, weakness and numbness.    I reviewed and updated the medication list, allergies, past medical history, past surgical history, family history, and social history.     Objective:   Physical Exam  Constitutional: No distress.  Cardiovascular: Normal rate and regular rhythm.  Exam reveals no gallop and no friction rub.   No murmur heard. No leg edema.  Pulmonary/Chest: Effort normal and breath sounds normal. No respiratory distress. He has no wheezes. He has no rales.  Abdominal: Soft. Bowel sounds are normal. He exhibits no distension. There is no tenderness. There is no rebound and no guarding.       Assessment & Plan:

## 2014-04-05 NOTE — Assessment & Plan Note (Signed)
Assessment: Patient has stable chronic low back pain which is well controlled on tramadol 50 mg every 8 hours as needed.  Plan: Continue tramadol 50 mg every 8 hours as needed.

## 2014-04-05 NOTE — Assessment & Plan Note (Signed)
BP Readings from Last 3 Encounters:  04/05/14 146/81  03/14/14 148/63  02/16/14 124/82    Lab Results  Component Value Date   NA 136 07/07/2013   K 4.6 07/07/2013   CREATININE 1.10 07/07/2013    Assessment: Blood pressure control: mildly elevated Progress toward BP goal:  deteriorated Comments: Patient blood pressure is mildly elevated today, but he has been out of his enalapril recently.  Plan: Medications:  Continue hydrochlorothiazide 25 mg daily and enalapril 5 mg daily; I advised patient of the importance of taking his medication as prescribed.

## 2014-04-05 NOTE — Telephone Encounter (Signed)
Unable to obtain adequate retinal images today. Assisted patient in making an eye appointment at [facility] for his yearly diabetes eye exam: He was given an appointment card with his appointment which is 2:30 Pm on Wednesday,  April 6th.

## 2014-04-05 NOTE — Patient Instructions (Addendum)
Please see your ophthalmologist for your annual eye exam; this is overdue. Please schedule a follow-up appointment with your urologist. Please bring your glucose meter and all medications to each clinic visit.

## 2014-04-05 NOTE — Assessment & Plan Note (Signed)
Assessment: Symptoms are well controlled on omeprazole 20 mg twice a day and ranitidine 150 mg daily at bedtime.  Plan: Continue current medications.

## 2014-04-06 LAB — MICROALBUMIN / CREATININE URINE RATIO
Creatinine, Urine: 289.4 mg/dL
MICROALB UR: 7.7 mg/dL — AB (ref <2.0)
MICROALB/CREAT RATIO: 26.6 mg/g (ref 0.0–30.0)

## 2014-04-06 MED ORDER — RANITIDINE HCL 150 MG PO CAPS
150.0000 mg | ORAL_CAPSULE | Freq: Every evening | ORAL | Status: DC
Start: 2014-04-06 — End: 2015-05-10

## 2014-04-06 MED ORDER — FLUTICASONE PROPIONATE 50 MCG/ACT NA SUSP: 2.0000 | Freq: Every day | NASAL | Status: DC

## 2014-04-06 MED ORDER — ATORVASTATIN CALCIUM 10 MG PO TABS: 10.0000 mg | ORAL_TABLET | Freq: Every day | ORAL | Status: DC

## 2014-04-06 MED ORDER — OMEPRAZOLE 20 MG PO CPDR: 20.0000 mg | DELAYED_RELEASE_CAPSULE | Freq: Two times a day (BID) | ORAL | Status: DC

## 2014-04-06 MED ORDER — PAROXETINE HCL 20 MG PO TABS: 20.0000 mg | ORAL_TABLET | Freq: Every day | ORAL | Status: DC

## 2014-04-06 MED ORDER — ALBUTEROL SULFATE HFA 108 (90 BASE) MCG/ACT IN AERS: 2.0000 | INHALATION_SPRAY | Freq: Four times a day (QID) | RESPIRATORY_TRACT | Status: DC | PRN

## 2014-04-06 MED ORDER — CETIRIZINE HCL 10 MG PO TABS: 10.0000 mg | ORAL_TABLET | Freq: Every day | ORAL | Status: DC

## 2014-04-06 MED ORDER — ENALAPRIL MALEATE 5 MG PO TABS: 5.0000 mg | ORAL_TABLET | Freq: Every day | ORAL | Status: DC

## 2014-04-08 ENCOUNTER — Encounter: Payer: Self-pay | Admitting: *Deleted

## 2014-04-08 ENCOUNTER — Telehealth: Payer: Self-pay | Admitting: *Deleted

## 2014-04-08 ENCOUNTER — Other Ambulatory Visit: Payer: Self-pay | Admitting: Internal Medicine

## 2014-04-08 DIAGNOSIS — D649 Anemia, unspecified: Secondary | ICD-10-CM

## 2014-04-08 NOTE — Progress Notes (Signed)
Quick Note:  LDL is at goal on atorvastatin 10 mg daily. ______

## 2014-04-08 NOTE — Progress Notes (Signed)
Quick Note:  Patient has a very mild anemia. Plan is to have him return for an anemia panel. ______

## 2014-04-08 NOTE — Telephone Encounter (Signed)
-----   Message from Bertha Stakes, MD sent at 04/08/2014 12:49 PM EDT ----- Regarding: Additional labs Patient has a very mild anemia (hemoglobin slightly below normal.)  Please have him return for an anemia panel.

## 2014-04-08 NOTE — Telephone Encounter (Signed)
Unable to contact patient as there is no contact number in the chart.  Letter mailed.Despina Hidden Cassady3/18/20164:31 PM

## 2014-06-18 ENCOUNTER — Other Ambulatory Visit: Payer: Self-pay | Admitting: Internal Medicine

## 2014-07-12 ENCOUNTER — Emergency Department (HOSPITAL_BASED_OUTPATIENT_CLINIC_OR_DEPARTMENT_OTHER)
Admission: EM | Admit: 2014-07-12 | Discharge: 2014-07-12 | Disposition: A | Discharge status: Home / Self Care | Payer: Medicaid Other | Attending: Emergency Medicine | Admitting: Emergency Medicine

## 2014-07-12 ENCOUNTER — Encounter (HOSPITAL_BASED_OUTPATIENT_CLINIC_OR_DEPARTMENT_OTHER): Payer: Self-pay | Admitting: Emergency Medicine

## 2014-07-12 DIAGNOSIS — F329 Major depressive disorder, single episode, unspecified: Secondary | ICD-10-CM | POA: Diagnosis not present

## 2014-07-12 DIAGNOSIS — Z7982 Long term (current) use of aspirin: Secondary | ICD-10-CM | POA: Insufficient documentation

## 2014-07-12 DIAGNOSIS — Z8669 Personal history of other diseases of the nervous system and sense organs: Secondary | ICD-10-CM | POA: Diagnosis not present

## 2014-07-12 DIAGNOSIS — Z86010 Personal history of colonic polyps: Secondary | ICD-10-CM | POA: Diagnosis not present

## 2014-07-12 DIAGNOSIS — S3992XA Unspecified injury of lower back, initial encounter: Secondary | ICD-10-CM | POA: Diagnosis not present

## 2014-07-12 DIAGNOSIS — Z7951 Long term (current) use of inhaled steroids: Secondary | ICD-10-CM | POA: Insufficient documentation

## 2014-07-12 DIAGNOSIS — Z87891 Personal history of nicotine dependence: Secondary | ICD-10-CM | POA: Insufficient documentation

## 2014-07-12 DIAGNOSIS — Y9389 Activity, other specified: Secondary | ICD-10-CM | POA: Diagnosis not present

## 2014-07-12 DIAGNOSIS — N400 Enlarged prostate without lower urinary tract symptoms: Secondary | ICD-10-CM | POA: Diagnosis not present

## 2014-07-12 DIAGNOSIS — I10 Essential (primary) hypertension: Secondary | ICD-10-CM | POA: Diagnosis not present

## 2014-07-12 DIAGNOSIS — E119 Type 2 diabetes mellitus without complications: Secondary | ICD-10-CM | POA: Insufficient documentation

## 2014-07-12 DIAGNOSIS — Z862 Personal history of diseases of the blood and blood-forming organs and certain disorders involving the immune mechanism: Secondary | ICD-10-CM | POA: Insufficient documentation

## 2014-07-12 DIAGNOSIS — Y92410 Unspecified street and highway as the place of occurrence of the external cause: Secondary | ICD-10-CM | POA: Insufficient documentation

## 2014-07-12 DIAGNOSIS — Y998 Other external cause status: Secondary | ICD-10-CM | POA: Insufficient documentation

## 2014-07-12 DIAGNOSIS — M542 Cervicalgia: Secondary | ICD-10-CM

## 2014-07-12 DIAGNOSIS — Z8709 Personal history of other diseases of the respiratory system: Secondary | ICD-10-CM | POA: Insufficient documentation

## 2014-07-12 DIAGNOSIS — Z8619 Personal history of other infectious and parasitic diseases: Secondary | ICD-10-CM | POA: Insufficient documentation

## 2014-07-12 DIAGNOSIS — K219 Gastro-esophageal reflux disease without esophagitis: Secondary | ICD-10-CM | POA: Insufficient documentation

## 2014-07-12 DIAGNOSIS — S199XXA Unspecified injury of neck, initial encounter: Secondary | ICD-10-CM | POA: Diagnosis present

## 2014-07-12 DIAGNOSIS — Z8739 Personal history of other diseases of the musculoskeletal system and connective tissue: Secondary | ICD-10-CM | POA: Diagnosis not present

## 2014-07-12 DIAGNOSIS — E785 Hyperlipidemia, unspecified: Secondary | ICD-10-CM | POA: Diagnosis not present

## 2014-07-12 DIAGNOSIS — Z79899 Other long term (current) drug therapy: Secondary | ICD-10-CM | POA: Insufficient documentation

## 2014-07-12 DIAGNOSIS — G8929 Other chronic pain: Secondary | ICD-10-CM | POA: Insufficient documentation

## 2014-07-12 MED ORDER — IBUPROFEN 600 MG PO TABS: 600.0000 mg | ORAL_TABLET | Freq: Four times a day (QID) | ORAL | Status: DC | PRN

## 2014-07-12 NOTE — Discharge Instructions (Signed)
Cervical Sprain A cervical sprain is when the tissues (ligaments) that hold the neck bones in place stretch or tear. HOME CARE   Put ice on the injured area.  Put ice in a plastic bag.  Place a towel between your skin and the bag.  Leave the ice on for 15-20 minutes, 3-4 times a day.  You may have been given a collar to wear. This collar keeps your neck from moving while you heal.  Do not take the collar off unless told by your doctor.  If you have long hair, keep it outside of the collar.  Ask your doctor before changing the position of your collar. You may need to change its position over time to make it more comfortable.  If you are allowed to take off the collar for cleaning or bathing, follow your doctor's instructions on how to do it safely.  Keep your collar clean by wiping it with mild soap and water. Dry it completely. If the collar has removable pads, remove them every 1-2 days to hand wash them with soap and water. Allow them to air dry. They should be dry before you wear them in the collar.  Do not drive while wearing the collar.  Only take medicine as told by your doctor.  Keep all doctor visits as told.  Keep all physical therapy visits as told.  Adjust your work station so that you have good posture while you work.  Avoid positions and activities that make your problems worse.  Warm up and stretch before being active. GET HELP IF:  Your pain is not controlled with medicine.  You cannot take less pain medicine over time as planned.  Your activity level does not improve as expected. GET HELP RIGHT AWAY IF:   You are bleeding.  Your stomach is upset.  You have an allergic reaction to your medicine.  You develop new problems that you cannot explain.  You lose feeling (become numb) or you cannot move any part of your body (paralysis).  You have tingling or weakness in any part of your body.  Your symptoms get worse. Symptoms include:  Pain,  soreness, stiffness, puffiness (swelling), or a burning feeling in your neck.  Pain when your neck is touched.  Shoulder or upper back pain.  Limited ability to move your neck.  Headache.  Dizziness.  Your hands or arms feel week, lose feeling, or tingle.  Muscle spasms.  Difficulty swallowing or chewing. MAKE SURE YOU:   Understand these instructions.  Will watch your condition.  Will get help right away if you are not doing well or get worse. Document Released: 06/26/2007 Document Revised: 09/09/2012 Document Reviewed: 07/15/2012 [facility] Patient Information 2015 [place], [place]. This information is not intended to replace advice given to you by your health care provider. Make sure you discuss any questions you have with your health care provider.  Please follow-up with [facility] and wellness if symptoms continue to persist beyond one week, please follow-up sooner as needed.
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
## 2014-07-29 ENCOUNTER — Other Ambulatory Visit: Payer: Self-pay | Admitting: Internal Medicine

## 2014-08-01 NOTE — Telephone Encounter (Signed)
Would you pls phone in ultram?

## 2014-08-01 NOTE — Telephone Encounter (Signed)
Needs July / Aug appt PCP 

## 2014-08-02 NOTE — Telephone Encounter (Signed)
Called to pharm 

## 2014-09-05 ENCOUNTER — Other Ambulatory Visit: Payer: Self-pay | Admitting: Internal Medicine

## 2014-09-15 ENCOUNTER — Encounter: Payer: Self-pay | Admitting: Internal Medicine

## 2014-09-15 ENCOUNTER — Encounter: Payer: Medicaid Other | Admitting: Internal Medicine

## 2014-10-13 ENCOUNTER — Other Ambulatory Visit: Payer: Self-pay | Admitting: Internal Medicine

## 2014-10-25 ENCOUNTER — Encounter: Payer: Self-pay | Admitting: Internal Medicine

## 2014-10-25 ENCOUNTER — Ambulatory Visit (INDEPENDENT_AMBULATORY_CARE_PROVIDER_SITE_OTHER): Payer: Medicaid Other | Admitting: Internal Medicine

## 2014-10-25 VITALS — BP 118/67 | HR 88 | Temp 98.1°F | Wt 313.8 lb

## 2014-10-25 DIAGNOSIS — Z79899 Other long term (current) drug therapy: Secondary | ICD-10-CM | POA: Diagnosis not present

## 2014-10-25 DIAGNOSIS — F329 Major depressive disorder, single episode, unspecified: Secondary | ICD-10-CM | POA: Diagnosis not present

## 2014-10-25 DIAGNOSIS — M25512 Pain in left shoulder: Secondary | ICD-10-CM | POA: Diagnosis not present

## 2014-10-25 DIAGNOSIS — E119 Type 2 diabetes mellitus without complications: Secondary | ICD-10-CM

## 2014-10-25 DIAGNOSIS — G5602 Carpal tunnel syndrome, left upper limb: Secondary | ICD-10-CM | POA: Diagnosis not present

## 2014-10-25 DIAGNOSIS — R3914 Feeling of incomplete bladder emptying: Secondary | ICD-10-CM

## 2014-10-25 DIAGNOSIS — Z23 Encounter for immunization: Secondary | ICD-10-CM | POA: Diagnosis not present

## 2014-10-25 DIAGNOSIS — Z7984 Long term (current) use of oral hypoglycemic drugs: Secondary | ICD-10-CM

## 2014-10-25 DIAGNOSIS — M545 Low back pain: Secondary | ICD-10-CM

## 2014-10-25 DIAGNOSIS — N401 Enlarged prostate with lower urinary tract symptoms: Secondary | ICD-10-CM | POA: Diagnosis not present

## 2014-10-25 DIAGNOSIS — Z0000 Encounter for general adult medical examination without abnormal findings: Secondary | ICD-10-CM

## 2014-10-25 DIAGNOSIS — G8929 Other chronic pain: Secondary | ICD-10-CM | POA: Diagnosis not present

## 2014-10-25 DIAGNOSIS — N400 Enlarged prostate without lower urinary tract symptoms: Secondary | ICD-10-CM

## 2014-10-25 DIAGNOSIS — I10 Essential (primary) hypertension: Secondary | ICD-10-CM | POA: Diagnosis not present

## 2014-10-25 DIAGNOSIS — F32A Depression, unspecified: Secondary | ICD-10-CM

## 2014-10-25 LAB — POCT GLYCOSYLATED HEMOGLOBIN (HGB A1C): HEMOGLOBIN A1C: 6.2

## 2014-10-25 LAB — GLUCOSE, CAPILLARY: GLUCOSE-CAPILLARY: 156 mg/dL — AB (ref 65–99)

## 2014-10-25 MED ORDER — TRAMADOL HCL 50 MG PO TABS: 50.0000 mg | ORAL_TABLET | Freq: Three times a day (TID) | ORAL | Status: DC | PRN

## 2014-10-25 MED ORDER — PAROXETINE HCL 20 MG PO TABS: 20.0000 mg | ORAL_TABLET | Freq: Every day | ORAL | Status: DC

## 2014-10-25 MED ORDER — VALACYCLOVIR HCL 500 MG PO TABS: 500.0000 mg | ORAL_TABLET | Freq: Every day | ORAL | Status: DC

## 2014-10-25 MED ORDER — ENALAPRIL MALEATE 5 MG PO TABS: 5.0000 mg | ORAL_TABLET | Freq: Every day | ORAL | Status: DC

## 2014-10-25 NOTE — Patient Instructions (Signed)
Mr. Nies it was nice meeting you today. I want to congratulate you on your recent weight loss. Keep up the good work! -Today I have referred you to sports medicine and urology. -Please remember to go for your back MRI. Our office will provide you with an appointment date. -Return to the clinic in 3-4 weeks for a follow-up.

## 2014-10-27 DIAGNOSIS — G5602 Carpal tunnel syndrome, left upper limb: Secondary | ICD-10-CM | POA: Insufficient documentation

## 2014-10-27 DIAGNOSIS — M25512 Pain in left shoulder: Secondary | ICD-10-CM | POA: Insufficient documentation

## 2014-10-27 NOTE — Assessment & Plan Note (Signed)
Paroxetine refilled today.

## 2014-10-27 NOTE — Assessment & Plan Note (Signed)
Patient complaining of left shoulder pain for the past 4 days. Denies any trauma to the area. Physical exam showing decreased range of motion of the shoulder joint and arm cannot be abducted past 90. -Tramadol for pain -Sports medicine referral -Reassess at next visit

## 2014-10-27 NOTE — Assessment & Plan Note (Signed)
BP Readings from Last 3 Encounters:  10/25/14 118/67  07/12/14 133/68  04/05/14 146/81    Lab Results  Component Value Date   NA 137 04/05/2014   K 4.3 04/05/2014   CREATININE 0.99 04/05/2014    Assessment: Blood pressure control:  controlled  Progress toward BP goal:   below goal (<140/90) Comments: Patient reports eating healthy and is working toward weight loss.  Plan: Medications: Continue hydrochlorothiazide 25 mg daily and enalapril 5 mg daily.

## 2014-10-27 NOTE — Assessment & Plan Note (Signed)
Patient endorses occasional pain shooting down his left wrist. States he had surgery done on his right wrist in the past for carpal tunnel syndrome. He is requesting a referral to surgery today for further evaluation. Physical exam showing negative Phalen and Tinel's sign. -Monitor for now. Reassess at next visit.

## 2014-10-27 NOTE — Assessment & Plan Note (Signed)
Patient states he is currently taking finasteride 5 mg daily for his BPH and stopped taking tamsulosin 1 year ago because he was told by his urologist Dr. Estill Dooms to so. Patient endorses urinating getting up 6-10 times each night to urinate because he cannot "get it all out." -Referral to urology

## 2014-10-27 NOTE — Assessment & Plan Note (Signed)
Lab Results  Component Value Date   HGBA1C 6.2 10/25/2014   HGBA1C 6.8 04/05/2014   HGBA1C 6.7 11/23/2013     Assessment: Diabetes control:   well controlled Progress toward A1C goal:    under goal (less than 7) Comments: Patient states he has been watching his diet and staying active to lose weight. States he is checking his blood glucose once daily. Glucose meter showing an average reading of 130, highest 161, and lowest 104. Patient denies having any symptoms of hypoglycemia.  Plan: Medications: continue metformin 1000 mg twice daily Instruction/counseling given: Encouraged him to keep working toward his weight loss goal. Other plans:  -reminded him to bring his glucose meter with him to the next visit

## 2014-10-27 NOTE — Progress Notes (Signed)
Patient ID: Carlos Bean, male   DOB: 11-Apr-1960, 54 y.o.   MRN: [id number]   Subjective:   Patient ID: Carlos Bean male   DOB: January 08, 1961 54 y.o.   MRN: [id number]  HPI: Carlos Bean is a 54 y.o.  male with a past medical history of hypertension, hyperlipidemia, type 2 diabetes, GERD, depression, chronic lower back pain, BPH, and carpal tunnel syndrome presenting to the clinic for follow-up of his chronic back pain. Patient reports having lower back pain for the past 20 years which resulted from work related injury. States during his previous visit he was prescribed tramadol which helps improve the stiffness in his back. However, he reports still experiencing pain when he carries out his activities of daily living and is concerned because the back pain is not radiating down his left leg. Denies any numbness, tingling, or weakness in his legs. Denies any urinary or fecal incontinence. She also reports having left shoulder pain that started 4 days ago when he woke up in the morning. States he cannot raise his left arm above the shoulder level. Finally, patient states he has had bilateral carpal tunnel syndrome since the year 1996 and his right wrist has already been operated on. Patient is requesting a referral to surgery to get his left wrist evaluated. States occasionally he gets pain shooting down his left wrist.    Past Medical History  Diagnosis Date  . Diabetes mellitus, type 2 ([place])   . Hypertension   . Hyperlipemia   . GERD (gastroesophageal reflux disease)   . Depression   . Chronic low back pain   . Obesity   . BPH (benign prostatic hypertrophy)   . Hematochezia     Referred to GI 05/17/2009, missed appointment.  . Elevated transaminase level     Mildly elevated SGPT  . Allergic rhinitis   . Constipation   . Carpal tunnel syndrome     S/P release transverse carpal ligament right wrist and decompression  median nerve right wrist by Dr. Geroge Baseman. Mortenson 02/25/2002.   Marland Kitchen Dyspnea on exertion     Spirometry normal 02/26/2001  . Anemia, unspecified     Mild normocytic anemia; Hgb=12.6 on 07/03/2005  . Atypical chest pain     Evaluated by Dr. Fransico Him in 2002; adenosine cardiolite was negative then.  . Genital herpes   . Irregular heart beat   . Unspecified disorder of male genital organs     Patient reports occasional bleeding in small amount from area of skin irritation on scrotum, none recent; exam 04/26/2009 normal.  . Tonsillitis 10/07/2008  . Bronchitis   . Sinusitis 06/2011  . Allergy     SEASONAL  . Arthritis     BACK  . Adenomatous polyp of colon 03/23/2014    Found on colonoscopy 03/14/2014 by Dr. Scarlette Shorts.   . Sigmoid diverticulosis 03/25/2014    Mild diverticulosis was noted in the sigmoid colon on colonoscopy by Dr. Henrene Pastor 03/14/2014    Current Outpatient Prescriptions  Medication Sig Dispense Refill  . ACCU-CHEK COMPACT PLUS test strip USE AS DIRECTED TO CHECK BLOOD SUGAR ONCE DAILY. DIAGNOSIS CODE ICD-9 250.00. 100 each 2  . albuterol (PROVENTIL HFA) 108 (90 BASE) MCG/ACT inhaler Inhale 2 puffs into the lungs 4 (four) times daily as needed for wheezing or shortness of breath. 6.7 each 5  . aspirin EC 81 MG tablet Take 81 mg by mouth daily.    Marland Kitchen atorvastatin (LIPITOR) 10 MG tablet Take 1  tablet (10 mg total) by mouth daily. 30 tablet 5  . Blood Glucose Monitoring Suppl (ACCU-CHEK COMPACT CARE KIT) KIT Use as directed. 1 each 0  . Blood Glucose Monitoring Suppl (BLOOD GLUCOSE METER) kit Use to check blood glucose twice a day before breakfast and supper     . cetirizine (ZYRTEC) 10 MG tablet Take 1 tablet (10 mg total) by mouth daily. 30 tablet 5  . enalapril (VASOTEC) 5 MG tablet Take 1 tablet (5 mg total) by mouth daily. 90 tablet 3  . fluticasone (FLONASE) 50 MCG/ACT nasal spray Place 2 sprays into both nostrils daily. 16 g 5  . glucose blood (ACCU-CHEK COMPACT PLUS) test strip USE AS DIRECTED TO CHECK BLOOD SUGAR ONCE DAILY. DIAGNOSIS CODE  ICD-10 E11.9 100 each 1  . hydrochlorothiazide (HYDRODIURIL) 25 MG tablet TAKE 1 TABLET BY MOUTH EVERY DAY 30 tablet 2  . ibuprofen (ADVIL,MOTRIN) 600 MG tablet Take 1 tablet (600 mg total) by mouth every 6 (six) hours as needed. 30 tablet 0  . Lancet Devices (ACCU-CHEK SOFTCLIX) lancets Use as instructed 1 each 6  . Lancets 28G MISC Use to test blood glucose twice a day before breakfast and before supper     . metFORMIN (GLUCOPHAGE) 1000 MG tablet TAKE 1 TABLET (1,000 MG TOTAL) BY MOUTH 2 (TWO) TIMES DAILY WITH A MEAL. 180 tablet 0  . omeprazole (PRILOSEC) 20 MG capsule Take 1 capsule (20 mg total) by mouth 2 (two) times daily before a meal. 60 capsule 5  . PARoxetine (PAXIL) 20 MG tablet Take 1 tablet (20 mg total) by mouth daily. 30 tablet 5  . PROVENTIL HFA 108 (90 BASE) MCG/ACT inhaler INHALE 2 PUFFS INTO THE LUNGS 4 (FOUR) TIMES DAILY AS NEEDED FOR WHEEZING OR SHORTNESS OF BREATH. 6.7 Inhaler 5  . ranitidine (ZANTAC) 150 MG capsule Take 1 capsule (150 mg total) by mouth every evening. 30 capsule 5  . tamsulosin (FLOMAX) 0.4 MG CAPS capsule Take 2 capsules (0.8 mg total) by mouth daily. (Patient not taking: Reported on 03/14/2014) 60 capsule 5  . traMADol (ULTRAM) 50 MG tablet Take 1 tablet (50 mg total) by mouth every 8 (eight) hours as needed. for pain 60 tablet 2  . valACYclovir (VALTREX) 500 MG tablet Take 1 tablet (500 mg total) by mouth daily. 30 tablet 2   No current facility-administered medications for this visit.   Family History  Problem Relation Age of Onset  . Breast cancer Sister     2 sisters with breast cancer  . Prostate cancer Brother 50  . Colon polyps Brother   . Diabetes type II Mother   . Kidney failure Mother   . Stroke Father     Late 24s  . Colon cancer Paternal Aunt 82   Social History   Social History  . Marital Status: Single    Spouse Name: N/A  . Number of Children: N/A  . Years of Education: 48   Social History Main Topics  . Smoking status:  Former Smoker -- 0.50 packs/day for 5 years    Quit date: 01/21/1990  . Smokeless tobacco: Never Used  . Alcohol Use: No  . Drug Use: No  . Sexual Activity: Not Asked   Other Topics Concern  . None   Social History Narrative   Review of Systems: Review of Systems  Constitutional: Negative for fever and chills.  HENT: Negative for ear pain.   Eyes: Negative for blurred vision and pain.  Respiratory: Negative for cough,  shortness of breath and wheezing.   Cardiovascular: Negative for chest pain, palpitations and leg swelling.  Gastrointestinal: Negative for nausea, vomiting, abdominal pain, diarrhea and constipation.  Genitourinary: Negative for dysuria, urgency and frequency.  Musculoskeletal: Positive for back pain and joint pain. Negative for myalgias.  Skin: Negative for itching and rash.  Neurological: Negative for dizziness, tingling, sensory change, focal weakness and headaches.   Objective:  Physical Exam: Filed Vitals:   10/25/14 1600  BP: 118/67  Pulse: 88  Temp: 98.1 F (36.7 C)  TempSrc: Oral  Weight: 313 lb 12.8 oz (142.339 kg)  SpO2: 99%   Physical Exam  Constitutional: He is oriented to person, place, and time. He appears well-developed and well-nourished. No distress.  HENT:  Head: Normocephalic and atraumatic.  Eyes: EOM are normal. Pupils are equal, round, and reactive to light.  Neck: Normal range of motion. Neck supple. No tracheal deviation present.  Cardiovascular: Normal rate, regular rhythm and intact distal pulses.  Exam reveals no gallop and no friction rub.   No murmur heard. Pulmonary/Chest: Effort normal. No respiratory distress. He has no wheezes. He has no rales.  Abdominal: Soft. Bowel sounds are normal. He exhibits no distension. There is no tenderness.  Musculoskeletal: He exhibits no edema.  Decreased range of motion of the left shoulder. The shoulder cannot be abducted beyond 90.  Tenderness on palpation of lumbar spine. No  paraspinal muscle tenderness. Straight leg raise test positive at 20 on the left and 30 on the right.  Wrists: Tinel's and Phalen's sign negative.  Neurological: He is alert and oriented to person, place, and time.  Skin: Skin is warm and dry.   Assessment & Plan:

## 2014-10-27 NOTE — Assessment & Plan Note (Signed)
Influenza vaccine administered today.

## 2014-10-27 NOTE — Assessment & Plan Note (Addendum)
Patient continues to have lower back pain. States tramadol given to him last time has helped improve his stiffness. He is now endorsing back pain radiating down his left leg. Denies any fecal or urinary incontinence. Physical exam showing tenderness on palpation of lumbar spine and straight leg raise test positive at 20 on the left and 30 on the right. -MRI of lumbar spine ordered today -Tramadol refilled -Sports medicine referral  Addendum (11/23/14 at 4:54 pm): MRI of lumbar spine showing -  IMPRESSION: 1. Lumbar spondylosis, degenerative disc disease, and congenitally short pedicles contribute to cause prominent impingement at L3-4 and L4-5 ; and moderate impingement at L2-3 and L5-S1, as detailed above. 2. Reduced sensitivity related to body habitus and motion artifact.  I called the patient to discuss the MRI findings with him. Unfortunately I could not reach him over the phone and could not leave a message because his voicemail was full.  -Urgent neurosurgery referral. Our clinic staff will schedule the appointment for him.

## 2014-10-31 NOTE — Progress Notes (Signed)
Internal Medicine Clinic Attending  I saw and evaluated the patient.  I personally confirmed the key portions of the history and exam documented by Dr. Rathore and I reviewed pertinent patient test results.  The assessment, diagnosis, and plan were formulated together and I agree with the documentation in the resident's note.  

## 2014-11-09 NOTE — Addendum Note (Signed)
Addended by: Yvonna Alanis E on: 11/09/2014 01:17 PM   Modules accepted: Orders

## 2014-11-16 ENCOUNTER — Other Ambulatory Visit: Payer: Self-pay | Admitting: Internal Medicine

## 2014-11-17 ENCOUNTER — Ambulatory Visit (HOSPITAL_COMMUNITY)
Admission: RE | Admit: 2014-11-17 | Discharge: 2014-11-17 | Disposition: A | Discharge status: Home / Self Care | Payer: Medicaid Other | Source: Ambulatory Visit | Attending: Internal Medicine | Admitting: Internal Medicine

## 2014-11-17 DIAGNOSIS — M5137 Other intervertebral disc degeneration, lumbosacral region: Secondary | ICD-10-CM | POA: Diagnosis not present

## 2014-11-17 DIAGNOSIS — M47896 Other spondylosis, lumbar region: Secondary | ICD-10-CM | POA: Diagnosis not present

## 2014-11-17 DIAGNOSIS — G8929 Other chronic pain: Secondary | ICD-10-CM

## 2014-11-17 DIAGNOSIS — M545 Low back pain: Secondary | ICD-10-CM | POA: Insufficient documentation

## 2014-11-23 NOTE — Addendum Note (Signed)
Addended by: Charlesetta Shanks on: 11/23/2014 05:07 PM   Modules accepted: Orders

## 2014-12-17 ENCOUNTER — Other Ambulatory Visit: Payer: Self-pay | Admitting: Internal Medicine

## 2014-12-22 ENCOUNTER — Other Ambulatory Visit: Payer: Self-pay | Admitting: Internal Medicine

## 2014-12-22 NOTE — Telephone Encounter (Signed)
Pt requesting tramadol to be filled. °

## 2014-12-22 NOTE — Telephone Encounter (Signed)
Left VM, pt should have 1 refill remaining at [facility]

## 2015-01-24 ENCOUNTER — Other Ambulatory Visit: Payer: Self-pay | Admitting: Internal Medicine

## 2015-02-24 ENCOUNTER — Other Ambulatory Visit: Payer: Self-pay | Admitting: Internal Medicine

## 2015-02-26 ENCOUNTER — Other Ambulatory Visit: Payer: Self-pay | Admitting: Internal Medicine

## 2015-02-27 NOTE — Telephone Encounter (Signed)
Tramadol rx called to [facility].

## 2015-03-03 ENCOUNTER — Other Ambulatory Visit: Payer: Self-pay | Admitting: Internal Medicine

## 2015-03-27 ENCOUNTER — Other Ambulatory Visit: Payer: Self-pay | Admitting: Internal Medicine

## 2015-03-27 NOTE — Telephone Encounter (Signed)
Called into [facility], pt scheduled in May

## 2015-04-08 ENCOUNTER — Encounter (HOSPITAL_BASED_OUTPATIENT_CLINIC_OR_DEPARTMENT_OTHER): Payer: Self-pay | Admitting: Emergency Medicine

## 2015-04-08 ENCOUNTER — Emergency Department (HOSPITAL_BASED_OUTPATIENT_CLINIC_OR_DEPARTMENT_OTHER)
Admission: EM | Admit: 2015-04-08 | Discharge: 2015-04-08 | Disposition: A | Discharge status: Home / Self Care | Payer: Medicaid Other | Attending: Emergency Medicine | Admitting: Emergency Medicine

## 2015-04-08 DIAGNOSIS — Z7951 Long term (current) use of inhaled steroids: Secondary | ICD-10-CM | POA: Diagnosis not present

## 2015-04-08 DIAGNOSIS — Z87891 Personal history of nicotine dependence: Secondary | ICD-10-CM | POA: Insufficient documentation

## 2015-04-08 DIAGNOSIS — Z86010 Personal history of colonic polyps: Secondary | ICD-10-CM | POA: Diagnosis not present

## 2015-04-08 DIAGNOSIS — G8929 Other chronic pain: Secondary | ICD-10-CM | POA: Diagnosis not present

## 2015-04-08 DIAGNOSIS — E785 Hyperlipidemia, unspecified: Secondary | ICD-10-CM | POA: Insufficient documentation

## 2015-04-08 DIAGNOSIS — K219 Gastro-esophageal reflux disease without esophagitis: Secondary | ICD-10-CM | POA: Insufficient documentation

## 2015-04-08 DIAGNOSIS — Z79899 Other long term (current) drug therapy: Secondary | ICD-10-CM | POA: Diagnosis not present

## 2015-04-08 DIAGNOSIS — Z8619 Personal history of other infectious and parasitic diseases: Secondary | ICD-10-CM | POA: Diagnosis not present

## 2015-04-08 DIAGNOSIS — Z87430 Personal history of prostatic dysplasia: Secondary | ICD-10-CM | POA: Insufficient documentation

## 2015-04-08 DIAGNOSIS — E669 Obesity, unspecified: Secondary | ICD-10-CM | POA: Diagnosis not present

## 2015-04-08 DIAGNOSIS — Z7982 Long term (current) use of aspirin: Secondary | ICD-10-CM | POA: Diagnosis not present

## 2015-04-08 DIAGNOSIS — R05 Cough: Secondary | ICD-10-CM | POA: Diagnosis present

## 2015-04-08 DIAGNOSIS — F329 Major depressive disorder, single episode, unspecified: Secondary | ICD-10-CM | POA: Insufficient documentation

## 2015-04-08 DIAGNOSIS — I10 Essential (primary) hypertension: Secondary | ICD-10-CM | POA: Diagnosis not present

## 2015-04-08 DIAGNOSIS — Z862 Personal history of diseases of the blood and blood-forming organs and certain disorders involving the immune mechanism: Secondary | ICD-10-CM | POA: Insufficient documentation

## 2015-04-08 DIAGNOSIS — J069 Acute upper respiratory infection, unspecified: Secondary | ICD-10-CM | POA: Insufficient documentation

## 2015-04-08 DIAGNOSIS — Z7984 Long term (current) use of oral hypoglycemic drugs: Secondary | ICD-10-CM | POA: Diagnosis not present

## 2015-04-08 DIAGNOSIS — M1990 Unspecified osteoarthritis, unspecified site: Secondary | ICD-10-CM | POA: Diagnosis not present

## 2015-04-08 DIAGNOSIS — E119 Type 2 diabetes mellitus without complications: Secondary | ICD-10-CM | POA: Insufficient documentation

## 2015-04-08 MED ORDER — FLUTICASONE PROPIONATE 50 MCG/ACT NA SUSP
2.0000 | Freq: Every day | NASAL | Status: DC
Start: 2015-04-08 — End: 2015-05-23

## 2015-04-08 MED ORDER — BENZONATATE 100 MG PO CAPS
200.0000 mg | ORAL_CAPSULE | Freq: Once | ORAL | Status: AC
  Administered 2015-04-08: 200 mg via ORAL
  Filled 2015-04-08: qty 2

## 2015-04-08 MED ORDER — BENZONATATE 100 MG PO CAPS: 100.0000 mg | ORAL_CAPSULE | Freq: Three times a day (TID) | ORAL | Status: DC

## 2015-04-08 NOTE — ED Notes (Signed)
Pt verbalizes understanding of d/c instructions and denies any further needs at this time. 

## 2015-04-08 NOTE — ED Notes (Signed)
Cough x2 weeks. "I just feel bad.  Can't sleep at night. I think I need a breathing treatment."

## 2015-04-08 NOTE — ED Provider Notes (Signed)
CSN: [id number]     Arrival date & time 04/08/15  0227 History   First MD Initiated Contact with Patient 04/08/15 0239     Chief Complaint  Patient presents with  . Cough     (Consider location/radiation/quality/duration/timing/severity/associated sxs/prior Treatment) Patient is a 55 y.o. male presenting with cough. The history is provided by the patient.  Cough Severity:  Moderate Onset quality:  Gradual Timing:  Sporadic Progression:  Unchanged Chronicity:  New Smoker: no   Context: not animal exposure   Relieved by:  Nothing Worsened by:  Nothing tried Ineffective treatments:  None tried Associated symptoms: sinus congestion   Associated symptoms: no chest pain, no fever and no shortness of breath   Risk factors: no recent infection     Past Medical History  Diagnosis Date  . Diabetes mellitus, type 2 ([place])   . Hypertension   . Hyperlipemia   . GERD (gastroesophageal reflux disease)   . Depression   . Chronic low back pain   . Obesity   . BPH (benign prostatic hypertrophy)   . Hematochezia     Referred to GI 05/17/2009, missed appointment.  . Elevated transaminase level     Mildly elevated SGPT  . Allergic rhinitis   . Constipation   . Carpal tunnel syndrome     S/P release transverse carpal ligament right wrist and decompression  median nerve right wrist by Dr. Geroge Baseman. Mortenson 02/25/2002.  Marland Kitchen Dyspnea on exertion     Spirometry normal 02/26/2001  . Anemia, unspecified     Mild normocytic anemia; Hgb=12.6 on 07/03/2005  . Atypical chest pain     Evaluated by Dr. Fransico Him in 2002; adenosine cardiolite was negative then.  . Genital herpes   . Irregular heart beat   . Unspecified disorder of male genital organs     Patient reports occasional bleeding in small amount from area of skin irritation on scrotum, none recent; exam 04/26/2009 normal.  . Tonsillitis 10/07/2008  . Bronchitis   . Sinusitis 06/2011  . Allergy     SEASONAL  . Arthritis     BACK  .  Adenomatous polyp of colon 03/23/2014    Found on colonoscopy 03/14/2014 by Dr. Scarlette Shorts.   . Sigmoid diverticulosis 03/25/2014    Mild diverticulosis was noted in the sigmoid colon on colonoscopy by Dr. Henrene Pastor 03/14/2014    Past Surgical History  Procedure Laterality Date  . Carpal tunnel release  02/25/2002    S/P release transverse carpal ligament right wrist and decompression  median nerve right wrist by Dr. Geroge Baseman. Mortenson 02/25/2002.   Family History  Problem Relation Age of Onset  . Breast cancer Sister     2 sisters with breast cancer  . Prostate cancer Brother 35  . Colon polyps Brother   . Diabetes type II Mother   . Kidney failure Mother   . Stroke Father     Late 68s  . Colon cancer Paternal Aunt 31   Social History  Substance Use Topics  . Smoking status: Former Smoker -- 0.50 packs/day for 5 years    Quit date: 01/21/1990  . Smokeless tobacco: Never Used  . Alcohol Use: No    Review of Systems  Constitutional: Negative for fever.  HENT: Positive for congestion. Negative for drooling.   Respiratory: Positive for cough. Negative for shortness of breath and stridor.   Cardiovascular: Negative for chest pain.  All other systems reviewed and are negative.     Allergies  Clarithromycin  Home Medications   Prior to Admission medications   Medication Sig Start Date End Date Taking? Authorizing Provider  ACCU-CHEK COMPACT PLUS test strip USE AS DIRECTED TO CHECK BLOOD SUGAR ONCE DAILY. DIAGNOSIS CODE ICD-9 250.00. 09/05/14   Shela Leff, MD  albuterol (PROVENTIL HFA) 108 (90 BASE) MCG/ACT inhaler Inhale 2 puffs into the lungs 4 (four) times daily as needed for wheezing or shortness of breath. 04/06/14   Bertha Stakes, MD  aspirin EC 81 MG tablet Take 81 mg by mouth daily.    Historical Provider, MD  atorvastatin (LIPITOR) 10 MG tablet TAKE 1 TABLET BY MOUTH EVERY DAY 01/25/15   Shela Leff, MD  Blood Glucose Monitoring Suppl (ACCU-CHEK COMPACT CARE KIT) KIT  Use as directed. 05/09/10   Bertha Stakes, MD  Blood Glucose Monitoring Suppl (BLOOD GLUCOSE METER) kit Use to check blood glucose twice a day before breakfast and supper     Historical Provider, MD  cetirizine (ZYRTEC) 10 MG tablet TAKE 1 TABLET BY MOUTH EVERY DAY 01/25/15   Shela Leff, MD  enalapril (VASOTEC) 5 MG tablet TAKE 1 TABLET BY MOUTH EVERY DAY 11/18/14   Shela Leff, MD  fluticasone (FLONASE) 50 MCG/ACT nasal spray Place 2 sprays into both nostrils daily. 04/06/14   Bertha Stakes, MD  glucose blood (ACCU-CHEK COMPACT PLUS) test strip USE AS DIRECTED TO CHECK BLOOD SUGAR ONCE DAILY. DIAGNOSIS CODE ICD-10 E11.9 02/17/14   Bertha Stakes, MD  hydrochlorothiazide (HYDRODIURIL) 25 MG tablet TAKE 1 TABLET BY MOUTH EVERY DAY 03/27/15   Axel Filler, MD  ibuprofen (ADVIL,MOTRIN) 600 MG tablet Take 1 tablet (600 mg total) by mouth every 6 (six) hours as needed. 07/12/14   Okey Regal, PA-C  Lancet Devices ([facility]) lancets Use as instructed 05/09/10 05/09/11  Bertha Stakes, MD  Lancets 28G MISC Use to test blood glucose twice a day before breakfast and before supper     Historical Provider, MD  metFORMIN (GLUCOPHAGE) 1000 MG tablet TAKE 1 TABLET (1,000 MG TOTAL) BY MOUTH 2 (TWO) TIMES DAILY WITH A MEAL. 03/27/15   Axel Filler, MD  omeprazole (PRILOSEC) 20 MG capsule TAKE ONE CAPSULE BY MOUTH TWICE A DAY BEFORE A MEAL 03/27/15   Axel Filler, MD  PARoxetine (PAXIL) 20 MG tablet Take 1 tablet (20 mg total) by mouth daily. 10/25/14   Shela Leff, MD  PROVENTIL HFA 108 (90 BASE) MCG/ACT inhaler INHALE 2 PUFFS INTO THE LUNGS 4 (FOUR) TIMES DAILY AS NEEDED FOR WHEEZING OR SHORTNESS OF BREATH. 06/21/14   Karlene Einstein, MD  ranitidine (ZANTAC) 150 MG capsule Take 1 capsule (150 mg total) by mouth every evening. 04/06/14   Bertha Stakes, MD  tamsulosin (FLOMAX) 0.4 MG CAPS capsule Take 2 capsules (0.8 mg total) by mouth daily. Patient not taking: Reported on  03/14/2014    Bertha Stakes, MD  traMADol (ULTRAM) 50 MG tablet TAKE 1 TABLET BY MOUTH EVERY 8 HOURS AS NEEDED FOR PAIN 03/27/15   Axel Filler, MD  valACYclovir (VALTREX) 500 MG tablet TAKE 1 TABLET (500 MG TOTAL) BY MOUTH DAILY. 03/06/15   Shela Leff, MD   BP 149/88 mmHg  Pulse 77  Temp(Src) 97.6 F (36.4 C) (Oral)  Resp 16  Ht '5\' 8"'  (1.727 m)  Wt 318 lb (144.244 kg)  BMI 48.36 kg/m2  SpO2 95% Physical Exam  Constitutional: He is oriented to person, place, and time. He appears well-developed and well-nourished. No distress.  HENT:  Head: Normocephalic and atraumatic.  Mouth/Throat: Oropharynx is clear and moist.  Eyes: Conjunctivae are normal. Pupils are equal, round, and reactive to light.  Neck: Normal range of motion. Neck supple.  Cardiovascular: Normal rate, regular rhythm and intact distal pulses.   Pulmonary/Chest: Effort normal and breath sounds normal. No respiratory distress. He has no wheezes. He has no rales. He exhibits no tenderness.  Abdominal: Soft. Bowel sounds are normal. There is no tenderness. There is no rebound and no guarding.  Musculoskeletal: Normal range of motion.  Neurological: He is alert and oriented to person, place, and time.  Skin: Skin is warm and dry.  Psychiatric: He has a normal mood and affect.    ED Course  Procedures (including critical care time) Labs Review Labs Reviewed - No data to display  Imaging Review No results found. I have personally reviewed and evaluated these images and lab results as part of my medical decision-making.   EKG Interpretation None      MDM   Final diagnoses:  None    URI: lungs are clear patient does not need a breathing treatment.  Will provider RX for flonase.      Veatrice Kells, MD 04/08/15 [phone]

## 2015-04-08 NOTE — Discharge Instructions (Signed)
Cool Mist Vaporizers  Vaporizers may help relieve the symptoms of a cough and cold. They add moisture to the air, which helps mucus to become thinner and less sticky. This makes it easier to breathe and cough up secretions. Cool mist vaporizers do not cause serious burns like hot mist vaporizers, which may also be called steamers or humidifiers. Vaporizers have not been proven to help with colds. You should not use a vaporizer if you are allergic to mold.  HOME CARE INSTRUCTIONS  · Follow the package instructions for the vaporizer.  · Do not use anything other than distilled water in the vaporizer.  · Do not run the vaporizer all of the time. This can cause mold or bacteria to grow in the vaporizer.  · Clean the vaporizer after each time it is used.  · Clean and dry the vaporizer well before storing it.  · Stop using the vaporizer if worsening respiratory symptoms develop.     This information is not intended to replace advice given to you by your health care provider. Make sure you discuss any questions you have with your health care provider.     Document Released: 10/05/2003 Document Revised: 01/12/2013 Document Reviewed: 05/27/2012  Elsevier Interactive Patient Education ©2016 [employer]

## 2015-04-21 ENCOUNTER — Encounter (HOSPITAL_BASED_OUTPATIENT_CLINIC_OR_DEPARTMENT_OTHER): Payer: Self-pay | Admitting: *Deleted

## 2015-04-21 ENCOUNTER — Emergency Department (HOSPITAL_BASED_OUTPATIENT_CLINIC_OR_DEPARTMENT_OTHER)
Admission: EM | Admit: 2015-04-21 | Discharge: 2015-04-21 | Disposition: A | Discharge status: Home / Self Care | Payer: Medicaid Other | Attending: Emergency Medicine | Admitting: Emergency Medicine

## 2015-04-21 DIAGNOSIS — Z8709 Personal history of other diseases of the respiratory system: Secondary | ICD-10-CM | POA: Diagnosis not present

## 2015-04-21 DIAGNOSIS — M25571 Pain in right ankle and joints of right foot: Secondary | ICD-10-CM | POA: Diagnosis present

## 2015-04-21 DIAGNOSIS — E785 Hyperlipidemia, unspecified: Secondary | ICD-10-CM | POA: Diagnosis not present

## 2015-04-21 DIAGNOSIS — Z79899 Other long term (current) drug therapy: Secondary | ICD-10-CM | POA: Insufficient documentation

## 2015-04-21 DIAGNOSIS — Z7951 Long term (current) use of inhaled steroids: Secondary | ICD-10-CM | POA: Diagnosis not present

## 2015-04-21 DIAGNOSIS — Z7982 Long term (current) use of aspirin: Secondary | ICD-10-CM | POA: Diagnosis not present

## 2015-04-21 DIAGNOSIS — I10 Essential (primary) hypertension: Secondary | ICD-10-CM | POA: Diagnosis not present

## 2015-04-21 DIAGNOSIS — E669 Obesity, unspecified: Secondary | ICD-10-CM | POA: Diagnosis not present

## 2015-04-21 DIAGNOSIS — G8929 Other chronic pain: Secondary | ICD-10-CM | POA: Insufficient documentation

## 2015-04-21 DIAGNOSIS — F329 Major depressive disorder, single episode, unspecified: Secondary | ICD-10-CM | POA: Diagnosis not present

## 2015-04-21 DIAGNOSIS — Z8619 Personal history of other infectious and parasitic diseases: Secondary | ICD-10-CM | POA: Insufficient documentation

## 2015-04-21 DIAGNOSIS — Z87891 Personal history of nicotine dependence: Secondary | ICD-10-CM | POA: Insufficient documentation

## 2015-04-21 DIAGNOSIS — K219 Gastro-esophageal reflux disease without esophagitis: Secondary | ICD-10-CM | POA: Diagnosis not present

## 2015-04-21 DIAGNOSIS — Z86010 Personal history of colonic polyps: Secondary | ICD-10-CM | POA: Insufficient documentation

## 2015-04-21 DIAGNOSIS — M779 Enthesopathy, unspecified: Secondary | ICD-10-CM

## 2015-04-21 DIAGNOSIS — Z7984 Long term (current) use of oral hypoglycemic drugs: Secondary | ICD-10-CM | POA: Diagnosis not present

## 2015-04-21 DIAGNOSIS — E119 Type 2 diabetes mellitus without complications: Secondary | ICD-10-CM | POA: Diagnosis not present

## 2015-04-21 DIAGNOSIS — M1990 Unspecified osteoarthritis, unspecified site: Secondary | ICD-10-CM | POA: Diagnosis not present

## 2015-04-21 MED ORDER — KETOROLAC TROMETHAMINE 30 MG/ML IJ SOLN
30.0000 mg | Freq: Once | INTRAMUSCULAR | Status: AC
  Administered 2015-04-21: 30 mg via INTRAMUSCULAR
  Filled 2015-04-21: qty 1

## 2015-04-21 MED ORDER — IBUPROFEN 800 MG PO TABS: 800.0000 mg | ORAL_TABLET | Freq: Three times a day (TID) | ORAL | Status: DC

## 2015-04-21 NOTE — Discharge Instructions (Signed)
1. Medications: Take ibuprofen as needed for pain, continue usual home medications 2. Treatment: rest, drink plenty of fluids, ice affected area (instructions below) 3. Follow Up: If symptoms do not improve over the next week, please make an appointment with the orthopedic clinic listed for discussion of your diagnoses and further evaluation after today's visit; Please return to the ER for new or worsening symptoms, any additional concerns.   COLD THERAPY DIRECTIONS:  Ice or gel packs can be used to reduce both pain and swelling. Ice is the most helpful within the first 24 to 48 hours after an injury or flareup from overusing a muscle or joint.  Ice is effective, has very few side effects, and is safe for most people to use.   If you expose your skin to cold temperatures for too long or without the proper protection, you can damage your skin or nerves. Watch for signs of skin damage due to cold.   HOME CARE INSTRUCTIONS  Follow these tips to use ice and cold packs safely.  Place a dry or damp towel between the ice and skin. A damp towel will cool the skin more quickly, so you may need to shorten the time that the ice is used.  For a more rapid response, add gentle compression to the ice.  Ice for no more than 10 to 20 minutes at a time. The bonier the area you are icing, the less time it will take to get the benefits of ice.  Check your skin after 5 minutes to make sure there are no signs of a poor response to cold or skin damage.  Rest 20 minutes or more in between uses.  Once your skin is numb, you can end your treatment. You can test numbness by very lightly touching your skin. The touch should be so light that you do not see the skin dimple from the pressure of your fingertip. When using ice, most people will feel these normal sensations in this order: cold, burning, aching, and numbness.

## 2015-04-21 NOTE — ED Provider Notes (Signed)
CSN: [id number]     Arrival date & time 04/21/15  1408 History   First MD Initiated Contact with Patient 04/21/15 1425     Chief Complaint  Patient presents with  . Ankle Pain     (Consider location/radiation/quality/duration/timing/severity/associated sxs/prior Treatment) Patient is a 55 y.o. male presenting with ankle pain. The history is provided by the patient and medical records. No language interpreter was used.  Ankle Pain Associated symptoms: no fever    Carlos Bean is a 55 y.o. male  with multiple chronic medical conditions who presents to the Emergency Department complaining of persistent right heel pain x 2 weeks. Patient describes pain as achy at rest and sharp with weightbearing. Aggravated by weightbearing. Patient is on Tylenol and tramadol for chronic back pain, stating this medication did not improve right heel pain.Patient denies injury or hx of injury to this extremity. No recent for quinolone or steroid use.  Past Medical History  Diagnosis Date  . Diabetes mellitus, type 2 ([place])   . Hypertension   . Hyperlipemia   . GERD (gastroesophageal reflux disease)   . Depression   . Chronic low back pain   . Obesity   . BPH (benign prostatic hypertrophy)   . Hematochezia     Referred to GI 05/17/2009, missed appointment.  . Elevated transaminase level     Mildly elevated SGPT  . Allergic rhinitis   . Constipation   . Carpal tunnel syndrome     S/P release transverse carpal ligament right wrist and decompression  median nerve right wrist by Dr. Geroge Baseman. Mortenson 02/25/2002.  Marland Kitchen Dyspnea on exertion     Spirometry normal 02/26/2001  . Anemia, unspecified     Mild normocytic anemia; Hgb=12.6 on 07/03/2005  . Atypical chest pain     Evaluated by Dr. Fransico Him in 2002; adenosine cardiolite was negative then.  . Genital herpes   . Irregular heart beat   . Unspecified disorder of male genital organs     Patient reports occasional bleeding in small amount from area  of skin irritation on scrotum, none recent; exam 04/26/2009 normal.  . Tonsillitis 10/07/2008  . Bronchitis   . Sinusitis 06/2011  . Allergy     SEASONAL  . Arthritis     BACK  . Adenomatous polyp of colon 03/23/2014    Found on colonoscopy 03/14/2014 by Dr. Scarlette Shorts.   . Sigmoid diverticulosis 03/25/2014    Mild diverticulosis was noted in the sigmoid colon on colonoscopy by Dr. Henrene Pastor 03/14/2014    Past Surgical History  Procedure Laterality Date  . Carpal tunnel release  02/25/2002    S/P release transverse carpal ligament right wrist and decompression  median nerve right wrist by Dr. Geroge Baseman. Mortenson 02/25/2002.   Family History  Problem Relation Age of Onset  . Breast cancer Sister     2 sisters with breast cancer  . Prostate cancer Brother 33  . Colon polyps Brother   . Diabetes type II Mother   . Kidney failure Mother   . Stroke Father     Late 34s  . Colon cancer Paternal Aunt 63   Social History  Substance Use Topics  . Smoking status: Former Smoker -- 0.50 packs/day for 5 years    Quit date: 01/21/1990  . Smokeless tobacco: Never Used  . Alcohol Use: No    Review of Systems  Constitutional: Negative for fever.  HENT: Negative for congestion.   Eyes: Negative for visual disturbance.  Respiratory: Negative for shortness of breath.   Cardiovascular: Negative for chest pain.  Gastrointestinal: Negative for abdominal pain.  Genitourinary: Negative for dysuria.  Musculoskeletal: Positive for myalgias and arthralgias.  Skin: Negative for color change.  Neurological: Negative for headaches.      Allergies  Clarithromycin  Home Medications   Prior to Admission medications   Medication Sig Start Date End Date Taking? Authorizing Provider  ACCU-CHEK COMPACT PLUS test strip USE AS DIRECTED TO CHECK BLOOD SUGAR ONCE DAILY. DIAGNOSIS CODE ICD-9 250.00. 09/05/14   Shela Leff, MD  albuterol (PROVENTIL HFA) 108 (90 BASE) MCG/ACT inhaler Inhale 2 puffs into the  lungs 4 (four) times daily as needed for wheezing or shortness of breath. 04/06/14   Bertha Stakes, MD  aspirin EC 81 MG tablet Take 81 mg by mouth daily.    Historical Provider, MD  atorvastatin (LIPITOR) 10 MG tablet TAKE 1 TABLET BY MOUTH EVERY DAY 01/25/15   Shela Leff, MD  benzonatate (TESSALON) 100 MG capsule Take 1 capsule (100 mg total) by mouth every 8 (eight) hours. 04/08/15   April Palumbo, MD  Blood Glucose Monitoring Suppl (ACCU-CHEK COMPACT CARE KIT) KIT Use as directed. 05/09/10   Bertha Stakes, MD  Blood Glucose Monitoring Suppl (BLOOD GLUCOSE METER) kit Use to check blood glucose twice a day before breakfast and supper     Historical Provider, MD  cetirizine (ZYRTEC) 10 MG tablet TAKE 1 TABLET BY MOUTH EVERY DAY 01/25/15   Shela Leff, MD  enalapril (VASOTEC) 5 MG tablet TAKE 1 TABLET BY MOUTH EVERY DAY 11/18/14   Shela Leff, MD  fluticasone (FLONASE) 50 MCG/ACT nasal spray Place 2 sprays into both nostrils daily. 04/06/14   Bertha Stakes, MD  fluticasone (FLONASE) 50 MCG/ACT nasal spray Place 2 sprays into both nostrils daily. 04/08/15   April Palumbo, MD  glucose blood (ACCU-CHEK COMPACT PLUS) test strip USE AS DIRECTED TO CHECK BLOOD SUGAR ONCE DAILY. DIAGNOSIS CODE ICD-10 E11.9 02/17/14   Bertha Stakes, MD  hydrochlorothiazide (HYDRODIURIL) 25 MG tablet TAKE 1 TABLET BY MOUTH EVERY DAY 03/27/15   Axel Filler, MD  ibuprofen (ADVIL,MOTRIN) 800 MG tablet Take 1 tablet (800 mg total) by mouth 3 (three) times daily. 04/21/15   [place], PA-C  Lancet Devices [facility]) lancets Use as instructed 05/09/10 05/09/11  Bertha Stakes, MD  Lancets 28G MISC Use to test blood glucose twice a day before breakfast and before supper     Historical Provider, MD  metFORMIN (GLUCOPHAGE) 1000 MG tablet TAKE 1 TABLET (1,000 MG TOTAL) BY MOUTH 2 (TWO) TIMES DAILY WITH A MEAL. 03/27/15   Axel Filler, MD  omeprazole (PRILOSEC) 20 MG capsule TAKE ONE CAPSULE BY MOUTH  TWICE A DAY BEFORE A MEAL 03/27/15   Axel Filler, MD  PARoxetine (PAXIL) 20 MG tablet Take 1 tablet (20 mg total) by mouth daily. 10/25/14   Shela Leff, MD  PROVENTIL HFA 108 (90 BASE) MCG/ACT inhaler INHALE 2 PUFFS INTO THE LUNGS 4 (FOUR) TIMES DAILY AS NEEDED FOR WHEEZING OR SHORTNESS OF BREATH. 06/21/14   Karlene Einstein, MD  ranitidine (ZANTAC) 150 MG capsule Take 1 capsule (150 mg total) by mouth every evening. 04/06/14   Bertha Stakes, MD  tamsulosin (FLOMAX) 0.4 MG CAPS capsule Take 2 capsules (0.8 mg total) by mouth daily. Patient not taking: Reported on 03/14/2014    Bertha Stakes, MD  traMADol (ULTRAM) 50 MG tablet TAKE 1 TABLET BY MOUTH EVERY 8 HOURS AS NEEDED FOR  PAIN 03/27/15   Axel Filler, MD  valACYclovir (VALTREX) 500 MG tablet TAKE 1 TABLET (500 MG TOTAL) BY MOUTH DAILY. 03/06/15   Shela Leff, MD   BP 142/74 mmHg  Pulse 88  Temp(Src) 97.8 F (36.6 C) (Oral)  Resp 18  SpO2 97% Physical Exam  Constitutional: He is oriented to person, place, and time. He appears well-developed and well-nourished.  Alert and in no acute distress  HENT:  Head: Normocephalic and atraumatic.  Cardiovascular: Normal rate, regular rhythm, normal heart sounds and intact distal pulses.  Exam reveals no gallop and no friction rub.   No murmur heard. Pulmonary/Chest: Effort normal and breath sounds normal. No respiratory distress. He has no wheezes. He has no rales. He exhibits no tenderness.  Abdominal: Soft. Bowel sounds are normal. He exhibits no distension and no mass. There is no tenderness. There is no rebound and no guarding.  Musculoskeletal:  Right Foot/Ankle: No gross deformity noted. Patient has full active and passive range of motion. There is no joint effusion noted. No erythema, or warmth overlaying the joint. There is tenderness to palpation over the achilles tendon. No pain to fifth metatarsal area, no pain to navicular region. 2+ DP pulses, sensation intact to  medial, lateral, dorsal and plantar aspects. Negative Thompson test.   Neurological: He is alert and oriented to person, place, and time.  Skin: Skin is warm and dry.  Nursing note and vitals reviewed.   ED Course  Procedures (including critical care time) Labs Review Labs Reviewed - No data to display  Imaging Review No results found. I have personally reviewed and evaluated these images and lab results as part of my medical decision-making.   EKG Interpretation None      MDM   Final diagnoses:  Tendonitis   Carlos Bean presents with right heel pain x 2 weeks. No known injury. On exam, patient is tender to palpation over the achilles tendon. Negative thompson test. 0 on [place] ankle and foot rules - no imaging warranted at this time. No recent fluoroquinolone or steroid use. Discussed RICE symptomatic treatment. Ace wrap and crutches provided in ED. Ibuprofen rx given. Follow up with ortho if symptoms do not improve. Return precautions given. All questions answered.   [facility] Feliciano Wynter, PA-C 04/21/15 [place], MD 04/26/15 [phone]

## 2015-04-21 NOTE — ED Notes (Signed)
Right ankle pain without known injury x 2 weeks.  Pt ambulatory without difficulty.

## 2015-05-08 ENCOUNTER — Other Ambulatory Visit: Payer: Self-pay | Admitting: Student in an Organized Health Care Education/Training Program

## 2015-05-08 ENCOUNTER — Other Ambulatory Visit: Payer: Self-pay | Admitting: Internal Medicine

## 2015-05-10 ENCOUNTER — Other Ambulatory Visit: Payer: Self-pay | Admitting: *Deleted

## 2015-05-10 MED ORDER — RANITIDINE HCL 150 MG PO CAPS: 150.0000 mg | ORAL_CAPSULE | Freq: Every evening | ORAL | Status: DC

## 2015-05-23 ENCOUNTER — Encounter: Payer: Self-pay | Admitting: Internal Medicine

## 2015-05-23 ENCOUNTER — Ambulatory Visit (INDEPENDENT_AMBULATORY_CARE_PROVIDER_SITE_OTHER): Payer: Medicaid Other | Admitting: Internal Medicine

## 2015-05-23 VITALS — BP 133/70 | HR 79 | Temp 98.3°F | Ht 68.0 in | Wt 324.9 lb

## 2015-05-23 DIAGNOSIS — E119 Type 2 diabetes mellitus without complications: Secondary | ICD-10-CM

## 2015-05-23 DIAGNOSIS — M545 Low back pain: Secondary | ICD-10-CM

## 2015-05-23 DIAGNOSIS — E785 Hyperlipidemia, unspecified: Secondary | ICD-10-CM

## 2015-05-23 DIAGNOSIS — A6000 Herpesviral infection of urogenital system, unspecified: Secondary | ICD-10-CM

## 2015-05-23 DIAGNOSIS — G8929 Other chronic pain: Secondary | ICD-10-CM

## 2015-05-23 DIAGNOSIS — J309 Allergic rhinitis, unspecified: Secondary | ICD-10-CM | POA: Diagnosis not present

## 2015-05-23 DIAGNOSIS — Z7984 Long term (current) use of oral hypoglycemic drugs: Secondary | ICD-10-CM

## 2015-05-23 DIAGNOSIS — K219 Gastro-esophageal reflux disease without esophagitis: Secondary | ICD-10-CM

## 2015-05-23 LAB — GLUCOSE, CAPILLARY: Glucose-Capillary: 83 mg/dL (ref 65–99)

## 2015-05-23 LAB — POCT GLYCOSYLATED HEMOGLOBIN (HGB A1C): HEMOGLOBIN A1C: 6.9

## 2015-05-23 MED ORDER — FLUTICASONE PROPIONATE 50 MCG/ACT NA SUSP: 2.0000 | Freq: Every day | NASAL | Status: DC

## 2015-05-23 MED ORDER — ACCU-CHEK AVIVA PLUS W/DEVICE KIT: PACK | Status: DC

## 2015-05-23 MED ORDER — TRAMADOL HCL 50 MG PO TABS: 50.0000 mg | ORAL_TABLET | Freq: Three times a day (TID) | ORAL | Status: DC | PRN

## 2015-05-23 MED ORDER — CETIRIZINE HCL 10 MG PO TABS: 10.0000 mg | ORAL_TABLET | Freq: Every day | ORAL | Status: DC

## 2015-05-23 MED ORDER — VALACYCLOVIR HCL 500 MG PO TABS: ORAL_TABLET | ORAL | Status: DC

## 2015-05-23 MED ORDER — ACCU-CHEK SOFTCLIX LANCETS MISC: Status: DC

## 2015-05-23 MED ORDER — GLUCOSE BLOOD VI STRP: ORAL_STRIP | Status: DC

## 2015-05-23 MED ORDER — METFORMIN HCL 1000 MG PO TABS: ORAL_TABLET | ORAL | Status: DC

## 2015-05-23 NOTE — Patient Instructions (Addendum)
Please check your blood glucose at least once daily as I explained. Check it before meals.

## 2015-05-24 LAB — LIPID PANEL
CHOL/HDL RATIO: 3.1 ratio (ref 0.0–5.0)
Cholesterol, Total: 178 mg/dL (ref 100–199)
HDL: 57 mg/dL (ref >39)
LDL Calculated: 88 mg/dL (ref 0–99)
TRIGLYCERIDES: 166 mg/dL — AB (ref 0–149)
VLDL Cholesterol Cal: 33 mg/dL (ref 5–40)

## 2015-05-26 MED ORDER — ATORVASTATIN CALCIUM 40 MG PO TABS: 40.0000 mg | ORAL_TABLET | Freq: Every day | ORAL | Status: DC

## 2015-05-26 MED ORDER — ALBUTEROL SULFATE HFA 108 (90 BASE) MCG/ACT IN AERS: 2.0000 | INHALATION_SPRAY | Freq: Four times a day (QID) | RESPIRATORY_TRACT | Status: DC | PRN

## 2015-05-26 NOTE — Assessment & Plan Note (Addendum)
Assessment: Symptoms are well controlled with Flonase and cetirizine.  Plan: -Refill Flonase -Refill cetirizine

## 2015-05-26 NOTE — Assessment & Plan Note (Signed)
Assessment: Patient reports having continued lower back pain that intermittently radiates to his bilateral lower extremities. MRI from 11/23/2014 showing lumbar spondylosis, degenerative disc disease, and prominent impingement at L3-4 and L4-5, and moderate impingement at L2-3 and L5-S1. Patient was given a neurosurgery referral at that time. States he was told by his neurosurgeon that he does not need surgery. Instead patient states neurosurgery told him to follow-up with pain management but he has not heard from the pain clinic yet. He is now having the same pain and describes it as lower back stiffness. States tramadol helps alleviate his pain and helps him function better. Physical exam exam showing positive straight leg raise tests at 10 on the left and 30 on the right. Patient denies having any focal weakness or numbness, saddle anesthesia, or bowel/bladder incontinence.  Plan: -Physical therapy referral for motor control exercises -Tramadol refilled

## 2015-05-26 NOTE — Progress Notes (Signed)
Patient ID: JAQUELL SEDDON, male   DOB: 1960-05-01, 55 y.o.   MRN: [id number]   Subjective:   Patient ID: ROMARI GASPARRO male   DOB: 1960/01/30 55 y.o.   MRN: [id number]  HPI: Mr.Lawrnce E Lamarque is a 55 y.o. male with a past medical history of conditions listed below presenting to the clinic for regular checkup and medication refills. Please see assessment and plan for the status of the patient's chronic medical conditions.    Past Medical History  Diagnosis Date  . Diabetes mellitus, type 2 ([facility])   . Hypertension   . Hyperlipemia   . GERD (gastroesophageal reflux disease)   . Depression   . Chronic low back pain   . Obesity   . BPH (benign prostatic hypertrophy)   . Hematochezia     Referred to GI 05/17/2009, missed appointment.  . Elevated transaminase level     Mildly elevated SGPT  . Allergic rhinitis   . Constipation   . Carpal tunnel syndrome     S/P release transverse carpal ligament right wrist and decompression  median nerve right wrist by Dr. Geroge Baseman. Mortenson 02/25/2002.  Marland Kitchen Dyspnea on exertion     Spirometry normal 02/26/2001  . Anemia, unspecified     Mild normocytic anemia; Hgb=12.6 on 07/03/2005  . Atypical chest pain     Evaluated by Dr. Fransico Him in 2002; adenosine cardiolite was negative then.  . Genital herpes   . Irregular heart beat   . Unspecified disorder of male genital organs     Patient reports occasional bleeding in small amount from area of skin irritation on scrotum, none recent; exam 04/26/2009 normal.  . Tonsillitis 10/07/2008  . Bronchitis   . Sinusitis 06/2011  . Allergy     SEASONAL  . Arthritis     BACK  . Adenomatous polyp of colon 03/23/2014    Found on colonoscopy 03/14/2014 by Dr. Scarlette Shorts.   . Sigmoid diverticulosis 03/25/2014    Mild diverticulosis was noted in the sigmoid colon on colonoscopy by Dr. Henrene Pastor 03/14/2014    Current Outpatient Prescriptions  Medication Sig Dispense Refill  . aspirin EC 81 MG tablet Take 81  mg by mouth daily.    Marland Kitchen atorvastatin (LIPITOR) 10 MG tablet TAKE 1 TABLET BY MOUTH EVERY DAY 30 tablet 2  . Blood Glucose Monitoring Suppl (ACCU-CHEK COMPACT CARE KIT) KIT Use as directed. 1 each 0  . Blood Glucose Monitoring Suppl (BLOOD GLUCOSE METER) kit Use to check blood glucose twice a day before breakfast and supper     . cetirizine (ZYRTEC) 10 MG tablet Take 1 tablet (10 mg total) by mouth daily. 90 tablet 3  . enalapril (VASOTEC) 5 MG tablet TAKE 1 TABLET BY MOUTH EVERY DAY 90 tablet 1  . fluticasone (FLONASE) 50 MCG/ACT nasal spray Place 2 sprays into both nostrils daily. 16 g 5  . hydrochlorothiazide (HYDRODIURIL) 25 MG tablet TAKE 1 TABLET BY MOUTH EVERY DAY 90 tablet 1  . ranitidine (ZANTAC) 150 MG capsule Take 1 capsule (150 mg total) by mouth every evening. 90 capsule 3  . traMADol (ULTRAM) 50 MG tablet Take 1 tablet (50 mg total) by mouth every 8 (eight) hours as needed. for pain 60 tablet 0  . valACYclovir (VALTREX) 500 MG tablet TAKE 1 TABLET (500 MG TOTAL) BY MOUTH DAILY. 90 tablet 3  . ACCU-CHEK SOFTCLIX LANCETS lancets Check blood sugar up to 1 time a day as directed 100 each 11  .  albuterol (PROVENTIL HFA) 108 (90 BASE) MCG/ACT inhaler Inhale 2 puffs into the lungs 4 (four) times daily as needed for wheezing or shortness of breath. 6.7 each 5  . benzonatate (TESSALON) 100 MG capsule Take 1 capsule (100 mg total) by mouth every 8 (eight) hours. 21 capsule 0  . Blood Glucose Monitoring Suppl (ACCU-CHEK AVIVA PLUS) w/Device KIT Check blood sugar up to 1 time a day as directed 1 kit 0  . glucose blood (ACCU-CHEK AVIVA PLUS) test strip Check blood sugar up to 1 time a day as directed 100 each 11  . ibuprofen (ADVIL,MOTRIN) 800 MG tablet Take 1 tablet (800 mg total) by mouth 3 (three) times daily. 21 tablet 0  . Lancet Devices (ACCU-CHEK SOFTCLIX) lancets Use as instructed 1 each 6  . metFORMIN (GLUCOPHAGE) 1000 MG tablet TAKE 1 TABLET (1,000 MG TOTAL) BY MOUTH 2 (TWO) TIMES DAILY  WITH A MEAL. 180 tablet 3  . omeprazole (PRILOSEC) 20 MG capsule TAKE ONE CAPSULE BY MOUTH TWICE A DAY BEFORE A MEAL 60 capsule 5  . PARoxetine (PAXIL) 20 MG tablet Take 1 tablet (20 mg total) by mouth daily. 30 tablet 5  . PROVENTIL HFA 108 (90 BASE) MCG/ACT inhaler INHALE 2 PUFFS INTO THE LUNGS 4 (FOUR) TIMES DAILY AS NEEDED FOR WHEEZING OR SHORTNESS OF BREATH. 6.7 Inhaler 5  . tamsulosin (FLOMAX) 0.4 MG CAPS capsule Take 2 capsules (0.8 mg total) by mouth daily. (Patient not taking: Reported on 03/14/2014) 60 capsule 5   No current facility-administered medications for this visit.   Family History  Problem Relation Age of Onset  . Breast cancer Sister     2 sisters with breast cancer  . Prostate cancer Brother 54  . Colon polyps Brother   . Diabetes type II Mother   . Kidney failure Mother   . Stroke Father     Late 38s  . Colon cancer Paternal Aunt 91   Social History   Social History  . Marital Status: Single    Spouse Name: N/A  . Number of Children: N/A  . Years of Education: 30   Social History Main Topics  . Smoking status: Former Smoker -- 0.50 packs/day for 5 years    Quit date: 01/21/1990  . Smokeless tobacco: Never Used  . Alcohol Use: No  . Drug Use: No  . Sexual Activity: Not Asked   Other Topics Concern  . None   Social History Narrative   Review of Systems: Review of Systems: Review of Systems  Constitutional: Negative for fever and chills.  HENT: Negative for ear pain.  Eyes: Negative for blurred vision and pain.  Respiratory: Negative for cough, shortness of breath and wheezing.  Cardiovascular: Negative for chest pain, palpitations and leg swelling.  Gastrointestinal: Negative for nausea, vomiting, abdominal pain, diarrhea and constipation.  Genitourinary: Negative for dysuria, urgency and frequency.  Musculoskeletal: Positive for back pain. Negative for myalgias.  Skin: Negative for itching and rash.  Neurological: Negative for dizziness,  tingling, sensory change, focal weakness and headaches.   Objective:  Physical Exam: Filed Vitals:   05/23/15 1346  BP: 133/70  Pulse: 79  Temp: 98.3 F (36.8 C)  TempSrc: Oral  Height: _0  (1.727 m)  Weight: 324 lb 14.4 oz (147.374 kg)  SpO2: 98%  Physical Exam  Constitutional: He is oriented to person, place, and time. He appears well-developed and well-nourished. No distress.  HENT:  Head: Normocephalic and atraumatic.  Eyes: EOM are normal. Pupils are equal,  round, and reactive to light.  Neck: Normal range of motion. Neck supple. No tracheal deviation present.  Cardiovascular: Normal rate, regular rhythm and intact distal pulses. Exam reveals no gallop and no friction rub.  No murmur heard. Pulmonary/Chest: Effort normal. No respiratory distress. He has no wheezes. He has no rales.  Abdominal: Soft. Bowel sounds are normal. He exhibits no distension. There is no tenderness.  Musculoskeletal: He exhibits no edema.  Tenderness on palpation of lumbar spine. Straight leg raise test positive at 10  on the left and 30 on the right. Neurological: He is alert and oriented to person, place, and time.  Skin: Skin is warm and dry.   Assessment & Plan:

## 2015-05-26 NOTE — Assessment & Plan Note (Signed)
Assessment: Symptoms are well controlled with ranitidine.  Plan: -Refill ranitidine

## 2015-05-26 NOTE — Assessment & Plan Note (Signed)
Lab Results  Component Value Date   HGBA1C 6.9 05/23/2015   HGBA1C 6.2 10/25/2014   HGBA1C 6.8 04/05/2014     Assessment: Diabetes control:  controlled (hemoglobin A1c less than 7) Comments: Patient is currently on metformin 1000 mg twice daily. He has gained 6 pounds since March 2017 and endorses eating a lot of fast food. Blood glucose meter shows he has not been checking his blood glucose regularly. Highest value to 72, average 193, and low 109. Patient denies having any symptoms of hypoglycemia such as dizziness, confusion, diaphoresis, or shaking.  Plan: Medications:  continue current medications Home glucose monitoring: Frequency:  at least once daily Timing:  before meals Instruction/counseling given: reminded to get eye exam, reminded to bring blood glucose meter & log to each visit, reminded to bring medications to each visit, discussed foot care, discussed the need for weight loss and discussed diet Other plans:  -Refilled metformin -Refilled diabetic testing supplies and give patient a prescription for a new meter

## 2015-05-26 NOTE — Assessment & Plan Note (Signed)
Assessment: Patient is currently taking valacyclovir and states he has not had an outbreak in the past year.  Plan: -Refill valacyclovir

## 2015-05-26 NOTE — Assessment & Plan Note (Addendum)
Assessment: Patient is currently on Lipitor 10 mg daily. Lipid panel at this visit showing cholesterol 170, triglycerides 166, HDL 57, and LDL 88. His 10 year ASCVD risk score is 19.8%.  Plan: -Increased dose of Lipitor to 40 mg daily. I tried calling the patient but his voice mail was full. I will request our clinic staff to contact the patient as well.

## 2015-05-29 ENCOUNTER — Telehealth: Payer: Self-pay | Admitting: *Deleted

## 2015-05-29 NOTE — Telephone Encounter (Signed)
Have tried to call pt 2 times, no answer, will continue to call

## 2015-05-31 NOTE — Progress Notes (Signed)
Case discussed with Dr. Rathore at the time of the visit.  We reviewed the resident's history and exam and pertinent patient test results.  I agree with the assessment, diagnosis and plan of care documented in the resident's note. 

## 2015-06-05 ENCOUNTER — Encounter: Payer: Self-pay | Admitting: *Deleted

## 2015-06-12 ENCOUNTER — Other Ambulatory Visit: Payer: Self-pay | Admitting: Internal Medicine

## 2015-06-12 NOTE — Telephone Encounter (Signed)
Chronic back pain being treated with Tramadol by Dr. Marlowe Sax. Received a one month supply 5/2. His follow up appointment is too far out in November. I have approved a two month supply, please schedule him follow up with Dr. Marlowe Sax for August so she can provide all future refills within visits.

## 2015-06-20 ENCOUNTER — Telehealth: Payer: Self-pay | Admitting: Pulmonary Disease

## 2015-06-20 ENCOUNTER — Other Ambulatory Visit: Payer: Self-pay | Admitting: Internal Medicine

## 2015-06-20 NOTE — Telephone Encounter (Signed)
Received page on call pager to return call to Carlos Bean at [phone] on 06/20/2015 at 5:10 PM. Attempted to return call x 3. Voicemail full.

## 2015-06-23 ENCOUNTER — Ambulatory Visit: Payer: No Typology Code available for payment source | Attending: Internal Medicine

## 2015-06-27 ENCOUNTER — Ambulatory Visit (INDEPENDENT_AMBULATORY_CARE_PROVIDER_SITE_OTHER): Payer: Medicaid Other | Admitting: Internal Medicine

## 2015-06-27 ENCOUNTER — Ambulatory Visit: Payer: No Typology Code available for payment source | Admitting: Physical Therapy

## 2015-06-27 ENCOUNTER — Encounter: Payer: Self-pay | Admitting: Internal Medicine

## 2015-06-27 VITALS — BP 126/69 | HR 84 | Temp 98.4°F | Ht 68.0 in | Wt 320.1 lb

## 2015-06-27 DIAGNOSIS — G8929 Other chronic pain: Secondary | ICD-10-CM

## 2015-06-27 DIAGNOSIS — I10 Essential (primary) hypertension: Secondary | ICD-10-CM | POA: Diagnosis not present

## 2015-06-27 DIAGNOSIS — Z7984 Long term (current) use of oral hypoglycemic drugs: Secondary | ICD-10-CM | POA: Diagnosis not present

## 2015-06-27 DIAGNOSIS — R319 Hematuria, unspecified: Secondary | ICD-10-CM | POA: Insufficient documentation

## 2015-06-27 DIAGNOSIS — N398 Other specified disorders of urinary system: Secondary | ICD-10-CM | POA: Diagnosis present

## 2015-06-27 DIAGNOSIS — M545 Low back pain: Secondary | ICD-10-CM

## 2015-06-27 DIAGNOSIS — E119 Type 2 diabetes mellitus without complications: Secondary | ICD-10-CM

## 2015-06-27 MED ORDER — ALBUTEROL SULFATE HFA 108 (90 BASE) MCG/ACT IN AERS: INHALATION_SPRAY | RESPIRATORY_TRACT | Status: DC

## 2015-06-27 MED ORDER — TRAMADOL HCL 50 MG PO TABS: 50.0000 mg | ORAL_TABLET | Freq: Three times a day (TID) | ORAL | Status: DC | PRN

## 2015-06-27 NOTE — Assessment & Plan Note (Signed)
Lab Results  Component Value Date   HGBA1C 6.9 05/23/2015   HGBA1C 6.2 10/25/2014   HGBA1C 6.8 04/05/2014     Assessment: Diabetes control:  well controlled  Progress toward A1C goal:   below goal (<7) Comments: Patient is currently only on an oral hypoglycemic agent (metformin 1000 mg bid). He has been eating healthy and walking a lot; lost 4 lbs in the past 1 month. He has been checking his blood glucose daily and meter showing any average value of 158. Meter showing lowest CBG 86; patient denies having any symptoms of hypoglycemia.   Plan: Medications:  continue current medications Home glucose monitoring: Frequency:   Timing:   Instruction/counseling given: reminded to get eye exam, reminded to bring blood glucose meter & log to each visit, reminded to bring medications to each visit, discussed foot care, discussed the need for weight loss and discussed diet Educational resources provided:  Educated patient on healthy eating and exercise. Encouraged him to keep working toward his weight loss goal. Other plans:  -Referral for eye exam -Repeat A1c at next visit in 3 months

## 2015-06-27 NOTE — Assessment & Plan Note (Signed)
BP Readings from Last 3 Encounters:  06/27/15 126/69  05/23/15 133/70  04/21/15 142/74    Lab Results  Component Value Date   NA 137 04/05/2014   K 4.3 04/05/2014   CREATININE 0.99 04/05/2014    Assessment: Blood pressure control:  well controlled  Progress toward BP goal:   below goal (<140/90) Comments: Patient is currently taking HCTZ 25 mg daily and Enalapril 5 mg daily. He was been eating healthy and walking a lot; lost 4 lbs in the past 1 month.   Plan: Medications:  continue current medications Educational resources provided:  Advised patient to keep working toward his weight loss goal.

## 2015-06-27 NOTE — Assessment & Plan Note (Signed)
Assessment Patient reports noticing bleeding from his scrotal region. The bleeding is likely from irritation of the skin as no lesions, fluctuance, or obvious source of bleeding could be identified on exam. Testicles are non-tender. No lesions or discharge noticed from the penis. No blood noticed around the anus. Patient denies having any penile discharge. Denies having any dysuria or hematuria. His BPH is well controlled with daily Finasteride. He is taking Valtrex prophylactically due to history of genital herpes; no outbreak at present. He does have a strong family history of prostate cancer. However, denies having any fevers, fatigue, or unintentional weight loss. He has chronic lower back pain from lumbar spondylosis, degenerative disk disease, and nerve impingement at L3-4, L4-5, L2-3, and L5-S1.   Plan -Patient has been advised to follow up with Urology as soon as possible -Will hold off ordering PSA at this time. Our office will try to get records from Dr. Lyndal Rainbow office.

## 2015-06-27 NOTE — Assessment & Plan Note (Signed)
Assessment  Patient has a history of chronic lower back pain secondary to lumbar spondylosis, degenerative disc disease, prominent nerve impingement at L3-4 and L4-5, and moderate impingement at L2-3 and L5-S1. Patient states he ran out his Tramadol prescription a few days ago and is experiencing worsening back pain. He denies having any focal weakness or numbness, saddle anesthesia, or bowel/ bladder incontinence. States Tramadol helps alleviate his pain and helps him function better. Reports taking 2 tablets/ day on "good days" and 3 tablets/ day on "bad days." States he can have up to 10-15 "bad days" in a month. He went to one session of physical therapy this morning but states he cannot continue to do so because his insurance will not pay for any further sessions. He had previously seen Neurosurgery and states they told hm he does not need surgery and instead needs to see a pain specialist. States he missed his previous appointment with pain management because he was busy taking care of his older brother who has cancer. He is requesting a referral to pain management again today.   Plan -Our office will be working on helping the patient get an appointment with pain management  -Tramadol 50 mg q8 prn, #60 with 2 refills  -F/u in 3 months

## 2015-06-27 NOTE — Therapy (Signed)
[facility] [address]  [place], [place], [address] Phone: [phone]   Fax:  [phone]  Patient Details  Name: Carlos Bean MRN: [id number] Date of Birth: May 14, 1960 Referring Provider:  Shela Leff, MD  Encounter Date: 06/27/2015    Pt arrived for PT initial evaluation.  Initiated evaluation by reviewing with pt that his insurance only covers one PT evaluation and no subsequent visits.  If pt wished to proceed with PT after today's session would need to continue as self-pay, which pt reports he is unable to do.    Pt also stating that he felt referral to pain management clinic would be more beneficial (he mentioned that he had a referral but missed appointment due to caring for brother recently diagnosed with cancer).  He requested to hold on PT evaluation until after seeing pain management MD.  At this time, will hold PT evaluation per pt request.  Educated pt that referral is good for 6 months and to reschedule if he would like formal evaluation with home exercises.  Pt verbalized understanding.  Laureen Abrahams, PT, DPT 06/27/2015 2:13 PM  [facility] [address]  [place], [place], [address] Phone: [phone]   Fax:  [phone]

## 2015-06-27 NOTE — Progress Notes (Signed)
Patient ID: Carlos Bean, male   DOB: [birth year], 55 y.o.   MRN: [id number]   Subjective:   Patient ID: Carlos Bean male   DOB: Mar 15, 1960 55 y.o.   MRN: [id number]  HPI: [name] is a 55 y.o. M with a PMHx of conditions listed below presenting to the clinic to discuss his HTN, diabetes, HLD, and chronic lower back pain. Please see assessment and plan for the status of the patient's chronic medical conditions.   Patient is also reports noticing bleeding from his scrotal region this morning when he was wiping the area with a towel after bathing. States this has been happening for the past 3 years, 2-4 episodes per year. Reports having some "stinging" in the scrotal area but denies noticing any rash or having any itching in the area. Denies having any penile sores/ lesions or discharge. Denies having any dysuria or hematuria. He has a history of BPH and is currently following Carlos Bean (Urology). He is taking Finasteride 5 mg everyday and denies having any straining with urination. He is currently sexually active and in a monogamous relationship with a male partner for the past 20 years. Patient has a history of genital herpes and is taking Valtrex prophylactically. He denies noticing any new lesions or sores on his genitalia. He does report a family history of prostate cancer in 3 brothers; all diagnosed in their 71s. Denies having any fevers, fatigue, or unintentional weight loss. He has chronic lower back pain from lumbar spondylosis, degenerative disk disease, and nerve impingement at L3-4, L4-5, L2-3, and L5-S1.     Past Medical History  Diagnosis Date  . Diabetes mellitus, type 2 ([place])   . Hypertension   . Hyperlipemia   . GERD (gastroesophageal reflux disease)   . Depression   . Chronic low back pain   . Obesity   . BPH (benign prostatic hypertrophy)   . Hematochezia     Referred to GI 05/17/2009, missed appointment.  . Elevated transaminase level     Mildly  elevated SGPT  . Allergic rhinitis   . Constipation   . Carpal tunnel syndrome     S/P release transverse carpal ligament right wrist and decompression  median nerve right wrist by Dr. Geroge Baseman. Mortenson 02/25/2002.  Marland Kitchen Dyspnea on exertion     Spirometry normal 02/26/2001  . Anemia, unspecified     Mild normocytic anemia; Hgb=12.6 on 07/03/2005  . Atypical chest pain     Evaluated by Dr. Fransico Him in 2002; adenosine cardiolite was negative then.  . Genital herpes   . Irregular heart beat   . Unspecified disorder of male genital organs     Patient reports occasional bleeding in small amount from area of skin irritation on scrotum, none recent; exam 04/26/2009 normal.  . Tonsillitis 10/07/2008  . Bronchitis   . Sinusitis 06/2011  . Allergy     SEASONAL  . Arthritis     BACK  . Adenomatous polyp of colon 03/23/2014    Found on colonoscopy 03/14/2014 by Dr. Scarlette Shorts.   . Sigmoid diverticulosis 03/25/2014    Mild diverticulosis was noted in the sigmoid colon on colonoscopy by Dr. Henrene Pastor 03/14/2014    Current Outpatient Prescriptions  Medication Sig Dispense Refill  . ACCU-CHEK SOFTCLIX LANCETS lancets Check blood sugar up to 1 time a day as directed 100 each 11  . aspirin EC 81 MG tablet Take 81 mg by mouth daily.    Marland Kitchen atorvastatin (LIPITOR) 40  MG tablet Take 1 tablet (40 mg total) by mouth daily. 90 tablet 3  . benzonatate (TESSALON) 100 MG capsule Take 1 capsule (100 mg total) by mouth every 8 (eight) hours. 21 capsule 0  . Blood Glucose Monitoring Suppl (ACCU-CHEK AVIVA PLUS) w/Device KIT Check blood sugar up to 1 time a day as directed 1 kit 0  . Blood Glucose Monitoring Suppl (ACCU-CHEK COMPACT CARE KIT) KIT Use as directed. 1 each 0  . Blood Glucose Monitoring Suppl (BLOOD GLUCOSE METER) kit Use to check blood glucose twice a day before breakfast and supper     . cetirizine (ZYRTEC) 10 MG tablet Take 1 tablet (10 mg total) by mouth daily. 90 tablet 3  . enalapril (VASOTEC) 5 MG tablet  TAKE 1 TABLET BY MOUTH EVERY DAY 90 tablet 1  . fluticasone (FLONASE) 50 MCG/ACT nasal spray Place 2 sprays into both nostrils daily. 16 g 5  . glucose blood (ACCU-CHEK AVIVA PLUS) test strip Check blood sugar up to 1 time a day as directed 100 each 11  . hydrochlorothiazide (HYDRODIURIL) 25 MG tablet TAKE 1 TABLET BY MOUTH EVERY DAY 90 tablet 1  . ibuprofen (ADVIL,MOTRIN) 800 MG tablet Take 1 tablet (800 mg total) by mouth 3 (three) times daily. 21 tablet 0  . Lancet Devices (ACCU-CHEK SOFTCLIX) lancets Use as instructed 1 each 6  . metFORMIN (GLUCOPHAGE) 1000 MG tablet TAKE 1 TABLET (1,000 MG TOTAL) BY MOUTH 2 (TWO) TIMES DAILY WITH A MEAL. 180 tablet 3  . omeprazole (PRILOSEC) 20 MG capsule TAKE ONE CAPSULE BY MOUTH TWICE A DAY BEFORE A MEAL 60 capsule 5  . PARoxetine (PAXIL) 20 MG tablet TAKE 1 TABLET (20 MG TOTAL) BY MOUTH DAILY. 30 tablet 5  . PROVENTIL HFA 108 (90 BASE) MCG/ACT inhaler INHALE 2 PUFFS INTO THE LUNGS 4 (FOUR) TIMES DAILY AS NEEDED FOR WHEEZING OR SHORTNESS OF BREATH. 6.7 Inhaler 5  . ranitidine (ZANTAC) 150 MG capsule Take 1 capsule (150 mg total) by mouth every evening. 90 capsule 3  . tamsulosin (FLOMAX) 0.4 MG CAPS capsule Take 2 capsules (0.8 mg total) by mouth daily. (Patient not taking: Reported on 03/14/2014) 60 capsule 5  . traMADol (ULTRAM) 50 MG tablet TAKE 1 TABLET BY MOUTH EVERY 8 HOURS AS NEEDED FOR PAIN 60 tablet 1  . valACYclovir (VALTREX) 500 MG tablet TAKE 1 TABLET (500 MG TOTAL) BY MOUTH DAILY. 90 tablet 3   No current facility-administered medications for this visit.   Family History  Problem Relation Age of Onset  . Breast cancer Sister     2 sisters with breast cancer  . Prostate cancer Brother 47  . Colon polyps Brother   . Diabetes type II Mother   . Kidney failure Mother   . Stroke Father     Late 109s  . Colon cancer Paternal Aunt 81   Social History   Social History  . Marital Status: Single    Spouse Name: N/A  . Number of Children: N/A   . Years of Education: 20   Social History Main Topics  . Smoking status: Former Smoker -- 0.50 packs/day for 5 years    Quit date: 01/21/1990  . Smokeless tobacco: Never Used  . Alcohol Use: No  . Drug Use: No  . Sexual Activity: Not on file   Other Topics Concern  . Not on file   Social History Narrative   Review of Systems: Review of Systems  Constitutional: Negative for fever, chills and malaise/fatigue.  Weight loss (intentional)   HENT: Negative for congestion and sore throat.   Eyes: Negative for blurred vision and pain.  Respiratory: Negative for cough, shortness of breath and wheezing.   Cardiovascular: Negative for chest pain, palpitations and leg swelling.  Gastrointestinal: Negative for nausea, vomiting, abdominal pain and diarrhea.  Genitourinary: Negative for dysuria, hematuria and flank pain.  Musculoskeletal: Negative for myalgias.       Chronic lower back pain   Skin: Negative for itching and rash.  Neurological: Negative for dizziness, sensory change, focal weakness and headaches.   Objective:  Physical Exam: There were no vitals filed for this visit. Physical Exam  Constitutional: He is oriented to person, place, and time. He appears well-developed and well-nourished. No distress.  HENT:  Head: Normocephalic and atraumatic.  Mouth/Throat: Oropharynx is clear and moist. No oropharyngeal exudate.  Eyes: EOM are normal. Pupils are equal, round, and reactive to light.  Neck: Normal range of motion. Neck supple. No tracheal deviation present.  Cardiovascular: Normal rate, regular rhythm and intact distal pulses.  Exam reveals no gallop and no friction rub.   No murmur heard. Pulmonary/Chest: Effort normal and breath sounds normal. No respiratory distress. He has no wheezes. He has no rales.  Abdominal: Soft. Bowel sounds are normal. He exhibits no distension. There is no tenderness. There is no guarding.  Genitourinary:  I was accompanied by nursing  staff and my attending physician during the exam. Patient had toilet paper covering his scrotum. Streaks of blood noticed on the toilet paper and on his underwear. However, no lesions, fluctuance, or obvious source of bleeding seen in the scrotal region. Testicles are non-tender. No lesions or discharge noticed from the penis. No blood noticed around the anus.   Musculoskeletal: He exhibits no edema.  Decreased ROM of lower back due to pain. Spine tender to palpation in the lumbar region. No paraspinal muscle tenderness.  Straight leg raise test positive at 30 degrees bilaterally.   Neurological: He is alert and oriented to person, place, and time.  Skin: Skin is warm and dry. No rash noted. He is not diaphoretic. No erythema.  Psychiatric: He has a normal mood and affect. His behavior is normal.   Assessment & Plan:

## 2015-06-27 NOTE — Patient Instructions (Signed)
I want to congratulate you on losing weight. Keep up the good work!  You do not need to check your blood sugar at home anymore. We will check it for you when you return to the clinic in 3 months.  Please make sure you follow-up with your urologist as soon as possible.

## 2015-06-28 NOTE — Progress Notes (Signed)
Internal Medicine Clinic Attending  I saw and evaluated the patient.  I personally confirmed the key portions of the history and exam documented by Dr. Marlowe Sax and I reviewed pertinent patient test results.  The assessment, diagnosis, and plan were formulated together and I agree with the documentation in the resident's note.  He had a complaint of minor bleeding from his scrotum. He could not tell us exactly where, body habitus made it a difficult exam. His scrotum appears mostly normal, there was a lot of dried tissue paper on his scrotum and perineum making it difficult to see every part of skin. No clear erythema, no tenderness so I doubt cellulitis or abscess. I did not see any clear lesion like a SCC or melanoma. I advised that he follow up with [place] in the future if he notices any further bleeding, but don't put any tissue paper on the site so I can better see the source of the bleeding.

## 2015-06-29 ENCOUNTER — Telehealth: Payer: Self-pay | Admitting: *Deleted

## 2015-06-29 NOTE — Telephone Encounter (Signed)
CALLED PATIENT LEFT VOICE MESSAGE FOR PATIENT TO RETURN CALL TO [place] / [facility]. NEED TO KNOW IF PATIENT HAS SEEN EYE DR. IN THE PATIENT. IF NOT, IS THERE A CERTAIN EYE DR. HE WOULD LIKE FOR [place] TO SCHEDULE HIM WITH.  PATIENT WAS INSTRUCTED TO RETURN CALL TO [phone].

## 2015-06-29 NOTE — Telephone Encounter (Signed)
CALLED PATIENT LEFT MESSAGE FOR PATIENT TO RETURN CALL TO THE CLINIC. NEED TO KNOW IF HE HAS SEEN EYE DOCTOR IN THE PAST. OR IF THERE IS A CERTAIN EYE DOCTOR HE WOULD LIKE FOR [place] TO SCHEDULE HIM WITH.

## 2015-06-30 ENCOUNTER — Telehealth: Payer: Self-pay | Admitting: *Deleted

## 2015-06-30 NOTE — Telephone Encounter (Signed)
SPOKE WITH PATIENT, HE IS TO CALL BACK WITH PHONE NUMBER TO EYE DR, IN [place] FOR THIS REFERRAL

## 2015-07-04 ENCOUNTER — Telehealth: Payer: Self-pay | Admitting: *Deleted

## 2015-07-04 NOTE — Telephone Encounter (Signed)
CALLED PATIENT LEFT VOICE MESSAGE FOR PATIENT WITH HIS EYE APPOINTMENT/ APPOINTMENT ALSO MAILED TO THE PATIENT.

## 2015-07-22 ENCOUNTER — Emergency Department (HOSPITAL_BASED_OUTPATIENT_CLINIC_OR_DEPARTMENT_OTHER)
Admission: EM | Admit: 2015-07-22 | Discharge: 2015-07-22 | Disposition: A | Discharge status: Home / Self Care | Payer: Medicaid Other | Attending: Emergency Medicine | Admitting: Emergency Medicine

## 2015-07-22 ENCOUNTER — Encounter (HOSPITAL_BASED_OUTPATIENT_CLINIC_OR_DEPARTMENT_OTHER): Payer: Self-pay | Admitting: *Deleted

## 2015-07-22 ENCOUNTER — Emergency Department (HOSPITAL_BASED_OUTPATIENT_CLINIC_OR_DEPARTMENT_OTHER): Payer: Medicaid Other

## 2015-07-22 DIAGNOSIS — E119 Type 2 diabetes mellitus without complications: Secondary | ICD-10-CM | POA: Diagnosis not present

## 2015-07-22 DIAGNOSIS — F329 Major depressive disorder, single episode, unspecified: Secondary | ICD-10-CM | POA: Insufficient documentation

## 2015-07-22 DIAGNOSIS — Z7982 Long term (current) use of aspirin: Secondary | ICD-10-CM | POA: Diagnosis not present

## 2015-07-22 DIAGNOSIS — E785 Hyperlipidemia, unspecified: Secondary | ICD-10-CM | POA: Insufficient documentation

## 2015-07-22 DIAGNOSIS — S0990XA Unspecified injury of head, initial encounter: Secondary | ICD-10-CM | POA: Insufficient documentation

## 2015-07-22 DIAGNOSIS — S0511XA Contusion of eyeball and orbital tissues, right eye, initial encounter: Secondary | ICD-10-CM | POA: Diagnosis not present

## 2015-07-22 DIAGNOSIS — S8001XA Contusion of right knee, initial encounter: Secondary | ICD-10-CM | POA: Insufficient documentation

## 2015-07-22 DIAGNOSIS — Z6841 Body Mass Index (BMI) 40.0 and over, adult: Secondary | ICD-10-CM | POA: Insufficient documentation

## 2015-07-22 DIAGNOSIS — Y999 Unspecified external cause status: Secondary | ICD-10-CM | POA: Insufficient documentation

## 2015-07-22 DIAGNOSIS — Z7984 Long term (current) use of oral hypoglycemic drugs: Secondary | ICD-10-CM | POA: Diagnosis not present

## 2015-07-22 DIAGNOSIS — I10 Essential (primary) hypertension: Secondary | ICD-10-CM | POA: Diagnosis not present

## 2015-07-22 DIAGNOSIS — Z87891 Personal history of nicotine dependence: Secondary | ICD-10-CM | POA: Insufficient documentation

## 2015-07-22 DIAGNOSIS — Y939 Activity, unspecified: Secondary | ICD-10-CM | POA: Insufficient documentation

## 2015-07-22 DIAGNOSIS — S0591XA Unspecified injury of right eye and orbit, initial encounter: Secondary | ICD-10-CM | POA: Diagnosis present

## 2015-07-22 DIAGNOSIS — H1131 Conjunctival hemorrhage, right eye: Secondary | ICD-10-CM

## 2015-07-22 DIAGNOSIS — Z79899 Other long term (current) drug therapy: Secondary | ICD-10-CM | POA: Insufficient documentation

## 2015-07-22 DIAGNOSIS — E669 Obesity, unspecified: Secondary | ICD-10-CM | POA: Diagnosis not present

## 2015-07-22 DIAGNOSIS — Y929 Unspecified place or not applicable: Secondary | ICD-10-CM | POA: Insufficient documentation

## 2015-07-22 NOTE — Discharge Instructions (Signed)
Subconjunctival Hemorrhage Subconjunctival hemorrhage is bleeding that happens between the white part of your eye (sclera) and the clear membrane that covers the outside of your eye (conjunctiva). There are many tiny blood vessels near the surface of your eye. A subconjunctival hemorrhage happens when one or more of these vessels breaks and bleeds, causing a red patch to appear on your eye. This is similar to a bruise. Depending on the amount of bleeding, the red patch may only cover a small area of your eye or it may cover the entire visible part of the sclera. If a lot of blood collects under the conjunctiva, there may also be swelling. Subconjunctival hemorrhages do not affect your vision or cause pain, but your eye may feel irritated if there is swelling. Subconjunctival hemorrhages usually do not require treatment, and they disappear on their own within two weeks. CAUSES This condition may be caused by:  Mild trauma, such as rubbing your eye too hard.  Severe trauma or blunt injuries.  Coughing, sneezing, or vomiting.  Straining, such as when lifting a heavy object.  High blood pressure.  Recent eye surgery.  A history of diabetes.  Certain medicines, especially blood thinners (anticoagulants).  Other conditions, such as eye tumors, bleeding disorders, or blood vessel abnormalities. Subconjunctival hemorrhages can happen without an obvious cause.  SYMPTOMS  Symptoms of this condition include:  A bright red or dark red patch on the white part of the eye.  The red area may spread out to cover a larger area of the eye before it goes away.  The red area may turn brownish-yellow before it goes away.  Swelling.  Mild eye irritation. DIAGNOSIS This condition is diagnosed with a physical exam. If your subconjunctival hemorrhage was caused by trauma, your health care provider may refer you to an eye specialist (ophthalmologist) or another specialist to check for other injuries. You  may have other tests, including:  An eye exam.  A blood pressure check.  Blood tests to check for bleeding disorders. If your subconjunctival hemorrhage was caused by trauma, X-rays or a CT scan may be done to check for other injuries. TREATMENT Usually, no treatment is needed. Your health care provider may recommend eye drops or cold compresses to help with discomfort. HOME CARE INSTRUCTIONS  Take over-the-counter and prescription medicines only as directed by your health care provider.  Use eye drops or cold compresses to help with discomfort as directed by your health care provider.  Avoid activities, things, and environments that may irritate or injure your eye.  Keep all follow-up visits as told by your health care provider. This is important. SEEK MEDICAL CARE IF:  You have pain in your eye.  The bleeding does not go away within 3 weeks.  You keep getting new subconjunctival hemorrhages. SEEK IMMEDIATE MEDICAL CARE IF:  Your vision changes or you have difficulty seeing.  You suddenly develop severe sensitivity to light.  You develop a severe headache, persistent vomiting, confusion, or abnormal tiredness (lethargy).  Your eye seems to bulge or protrude from your eye socket.  You develop unexplained bruises on your body.  You have unexplained bleeding in another area of your body.   This information is not intended to replace advice given to you by your health care provider. Make sure you discuss any questions you have with your health care provider.   Document Released: 01/07/2005 Document Revised: 09/28/2014 Document Reviewed: 03/16/2014 Elsevier Interactive Patient Education 2016 [place] Injury, Adult You have  a head injury. Headaches and throwing up (vomiting) are common after a head injury. It should be easy to wake up from sleeping. Sometimes you must stay in the hospital. Most problems happen within the first 24 hours. Side effects may occur up  to 7-10 days after the injury.  WHAT ARE THE TYPES OF HEAD INJURIES? Head injuries can be as minor as a bump. Some head injuries can be more severe. More severe head injuries include:  A jarring injury to the brain (concussion).  A bruise of the brain (contusion). This mean there is bleeding in the brain that can cause swelling.  A cracked skull (skull fracture).  Bleeding in the brain that collects, clots, and forms a bump (hematoma). WHEN SHOULD I GET HELP RIGHT AWAY?   You are confused or sleepy.  You cannot be woken up.  You feel sick to your stomach (nauseous) or keep throwing up (vomiting).  Your dizziness or unsteadiness is getting worse.  You have very bad, lasting headaches that are not helped by medicine. Take medicines only as told by your doctor.  You cannot use your arms or legs like normal.  You cannot walk.  You notice changes in the black spots in the center of the colored part of your eye (pupil).  You have clear or bloody fluid coming from your nose or ears.  You have trouble seeing. During the next 24 hours after the injury, you must stay with someone who can watch you. This person should get help right away (call 911 in the [place] if you start to shake and are not able to control it (have seizures), you pass out, or you are unable to wake up. HOW CAN I PREVENT A HEAD INJURY IN THE FUTURE?  Wear seat belts.  Wear a helmet while bike riding and playing sports like football.  Stay away from dangerous activities around the house. WHEN CAN I RETURN TO NORMAL ACTIVITIES AND ATHLETICS? See your doctor before doing these activities. You should not do normal activities or play contact sports until 1 week after the following symptoms have stopped:  Headache that does not go away.  Dizziness.  Poor attention.  Confusion.  Memory problems.  Sickness to your stomach or throwing up.  Tiredness.  Fussiness.  Bothered by bright lights or loud  noises.  Anxiousness or depression.  Restless sleep. MAKE SURE YOU:   Understand these instructions.  Will watch your condition.  Will get help right away if you are not doing well or get worse.   This information is not intended to replace advice given to you by your health care provider. Make sure you discuss any questions you have with your health care provider.   Document Released: 12/21/2007 Document Revised: 01/28/2014 Document Reviewed: 09/14/2012 Elsevier Interactive Patient Education [employer].

## 2015-07-22 NOTE — ED Notes (Signed)
Patient transported to CT 
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
## 2015-07-22 NOTE — ED Notes (Signed)
MD at bedside. 
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
## 2015-08-05 ENCOUNTER — Encounter (HOSPITAL_BASED_OUTPATIENT_CLINIC_OR_DEPARTMENT_OTHER): Payer: Self-pay | Admitting: *Deleted

## 2015-08-05 ENCOUNTER — Emergency Department (HOSPITAL_BASED_OUTPATIENT_CLINIC_OR_DEPARTMENT_OTHER): Payer: Medicaid Other

## 2015-08-05 ENCOUNTER — Emergency Department (HOSPITAL_BASED_OUTPATIENT_CLINIC_OR_DEPARTMENT_OTHER)
Admission: EM | Admit: 2015-08-05 | Discharge: 2015-08-05 | Disposition: A | Discharge status: Home / Self Care | Payer: Medicaid Other | Attending: Emergency Medicine | Admitting: Emergency Medicine

## 2015-08-05 DIAGNOSIS — F329 Major depressive disorder, single episode, unspecified: Secondary | ICD-10-CM | POA: Insufficient documentation

## 2015-08-05 DIAGNOSIS — E785 Hyperlipidemia, unspecified: Secondary | ICD-10-CM | POA: Diagnosis not present

## 2015-08-05 DIAGNOSIS — W010XXA Fall on same level from slipping, tripping and stumbling without subsequent striking against object, initial encounter: Secondary | ICD-10-CM | POA: Diagnosis not present

## 2015-08-05 DIAGNOSIS — Y929 Unspecified place or not applicable: Secondary | ICD-10-CM | POA: Insufficient documentation

## 2015-08-05 DIAGNOSIS — Z7984 Long term (current) use of oral hypoglycemic drugs: Secondary | ICD-10-CM | POA: Insufficient documentation

## 2015-08-05 DIAGNOSIS — Z79899 Other long term (current) drug therapy: Secondary | ICD-10-CM | POA: Diagnosis not present

## 2015-08-05 DIAGNOSIS — I10 Essential (primary) hypertension: Secondary | ICD-10-CM | POA: Insufficient documentation

## 2015-08-05 DIAGNOSIS — M79672 Pain in left foot: Secondary | ICD-10-CM | POA: Diagnosis present

## 2015-08-05 DIAGNOSIS — E119 Type 2 diabetes mellitus without complications: Secondary | ICD-10-CM | POA: Insufficient documentation

## 2015-08-05 DIAGNOSIS — S93402A Sprain of unspecified ligament of left ankle, initial encounter: Secondary | ICD-10-CM | POA: Insufficient documentation

## 2015-08-05 DIAGNOSIS — Z87891 Personal history of nicotine dependence: Secondary | ICD-10-CM | POA: Insufficient documentation

## 2015-08-05 DIAGNOSIS — Y999 Unspecified external cause status: Secondary | ICD-10-CM | POA: Diagnosis not present

## 2015-08-05 DIAGNOSIS — Y9301 Activity, walking, marching and hiking: Secondary | ICD-10-CM | POA: Diagnosis not present

## 2015-08-05 DIAGNOSIS — Z7982 Long term (current) use of aspirin: Secondary | ICD-10-CM | POA: Diagnosis not present
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
## 2015-08-05 NOTE — Discharge Instructions (Signed)
Ankle Sprain  An ankle sprain is an injury to the strong, fibrous tissues (ligaments) that hold the bones of your ankle joint together.   CAUSES  An ankle sprain is usually caused by a fall or by twisting your ankle. Ankle sprains most commonly occur when you step on the outer edge of your foot, and your ankle turns inward. People who participate in sports are more prone to these types of injuries.   SYMPTOMS    Pain in your ankle. The pain may be present at rest or only when you are trying to stand or walk.   Swelling.   Bruising. Bruising may develop immediately or within 1 to 2 days after your injury.   Difficulty standing or walking, particularly when turning corners or changing directions.  DIAGNOSIS   Your caregiver will ask you details about your injury and perform a physical exam of your ankle to determine if you have an ankle sprain. During the physical exam, your caregiver will press on and apply pressure to specific areas of your foot and ankle. Your caregiver will try to move your ankle in certain ways. An X-ray exam may be done to be sure a bone was not broken or a ligament did not separate from one of the bones in your ankle (avulsion fracture).   TREATMENT   Certain types of braces can help stabilize your ankle. Your caregiver can make a recommendation for this. Your caregiver may recommend the use of medicine for pain. If your sprain is severe, your caregiver may refer you to a surgeon who helps to restore function to parts of your skeletal system (orthopedist) or a physical therapist.  HOME CARE INSTRUCTIONS    Apply ice to your injury for 1-2 days or as directed by your caregiver. Applying ice helps to reduce inflammation and pain.    Put ice in a plastic bag.    Place a towel between your skin and the bag.    Leave the ice on for 15-20 minutes at a time, every 2 hours while you are awake.   Only take over-the-counter or prescription medicines for pain, discomfort, or fever as directed by  your caregiver.   Elevate your injured ankle above the level of your heart as much as possible for 2-3 days.   If your caregiver recommends crutches, use them as instructed. Gradually put weight on the affected ankle. Continue to use crutches or a cane until you can walk without feeling pain in your ankle.   If you have a plaster splint, wear the splint as directed by your caregiver. Do not rest it on anything harder than a pillow for the first 24 hours. Do not put weight on it. Do not get it wet. You may take it off to take a shower or bath.   You may have been given an elastic bandage to wear around your ankle to provide support. If the elastic bandage is too tight (you have numbness or tingling in your foot or your foot becomes cold and blue), adjust the bandage to make it comfortable.   If you have an air splint, you may blow more air into it or let air out to make it more comfortable. You may take your splint off at night and before taking a shower or bath. Wiggle your toes in the splint several times per day to decrease swelling.  SEEK MEDICAL CARE IF:    You have rapidly increasing bruising or swelling.   Your toes feel   extremely cold or you lose feeling in your foot.   Your pain is not relieved with medicine.  SEEK IMMEDIATE MEDICAL CARE IF:   Your toes are numb or blue.   You have severe pain that is increasing.  MAKE SURE YOU:    Understand these instructions.   Will watch your condition.   Will get help right away if you are not doing well or get worse.     This information is not intended to replace advice given to you by your health care provider. Make sure you discuss any questions you have with your health care provider.     Document Released: 01/07/2005 Document Revised: 01/28/2014 Document Reviewed: 01/19/2011  Elsevier Interactive Patient Education 2016 [employer]
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
## 2015-08-05 NOTE — ED Provider Notes (Signed)
CSN: [id number]     Arrival date & time 08/05/15  0820 History   First MD Initiated Contact with Patient 08/05/15 [phone]     Chief Complaint  Patient presents with  . Foot Pain      Patient is a 55 y.o. male presenting with lower extremity pain. The history is provided by the patient.  Foot Pain This is a new problem.  Patient states that one week ago he was walking and his left foot got twisted back under him. States the pain is gotten worse since a week ago. States he's been walking on it. No other injury. States it hurts on the front of his foot that is more painful with walking.  Past Medical History  Diagnosis Date  . Diabetes mellitus, type 2 (HCC)   . Hypertension   . Hyperlipemia   . GERD (gastroesophageal reflux disease)   . Depression   . Chronic low back pain   . Obesity   . BPH (benign prostatic hypertrophy)   . Hematochezia     Referred to GI 05/17/2009, missed appointment.  . Elevated transaminase level     Mildly elevated SGPT  . Allergic rhinitis   . Constipation   . Carpal tunnel syndrome     S/P release transverse carpal ligament right wrist and decompression  median nerve right wrist by Dr. Lenard Galloway. Mortenson 02/25/2002.  Marland Kitchen Dyspnea on exertion     Spirometry normal 02/26/2001  . Anemia, unspecified     Mild normocytic anemia; Hgb=12.6 on 07/03/2005  . Atypical chest pain     Evaluated by Dr. Armanda Magic in 2002; adenosine cardiolite was negative then.  . Genital herpes   . Irregular heart beat   . Unspecified disorder of male genital organs     Patient reports occasional bleeding in small amount from area of skin irritation on scrotum, none recent; exam 04/26/2009 normal.  . Tonsillitis 10/07/2008  . Bronchitis   . Sinusitis 06/2011  . Allergy     SEASONAL  . Arthritis     BACK  . Adenomatous polyp of colon 03/23/2014    Found on colonoscopy 03/14/2014 by Dr. Yancey Flemings.   . Sigmoid diverticulosis 03/25/2014    Mild diverticulosis was noted in the sigmoid  colon on colonoscopy by Dr. Marina Goodell 03/14/2014    Past Surgical History  Procedure Laterality Date  . Carpal tunnel release  02/25/2002    S/P release transverse carpal ligament right wrist and decompression  median nerve right wrist by Dr. Lenard Galloway. Mortenson 02/25/2002.  Marland Kitchen Epidural sterior injections      spine   Family History  Problem Relation Age of Onset  . Breast cancer Sister     2 sisters with breast cancer  . Prostate cancer Brother 39  . Colon polyps Brother   . Diabetes type II Mother   . Kidney failure Mother   . Stroke Father     Late 62s  . Colon cancer Paternal Aunt 26   Social History  Substance Use Topics  . Smoking status: Former Smoker -- 0.50 packs/day for 5 years    Quit date: 01/21/1990  . Smokeless tobacco: Never Used  . Alcohol Use: No    Review of Systems  Constitutional: Negative for appetite change.  Musculoskeletal: Positive for joint swelling. Negative for myalgias.  Skin: Negative for wound.  Neurological: Negative for weakness and numbness.      Allergies  Clarithromycin  Home Medications   Prior to Admission medications  Medication Sig Start Date End Date Taking? Authorizing Provider  ACCU-CHEK SOFTCLIX LANCETS lancets Check blood sugar up to 1 time a day as directed 05/23/15  Yes Shela Leff, MD  albuterol (PROVENTIL HFA) 108 (90 Base) MCG/ACT inhaler INHALE 2 PUFFS INTO THE LUNGS 4 (FOUR) TIMES DAILY AS NEEDED FOR WHEEZING OR SHORTNESS OF BREATH. 06/27/15  Yes Shela Leff, MD  aspirin EC 81 MG tablet Take 81 mg by mouth daily.   Yes Historical Provider, MD  atorvastatin (LIPITOR) 40 MG tablet Take 1 tablet (40 mg total) by mouth daily. 05/26/15  Yes Shela Leff, MD  Blood Glucose Monitoring Suppl (ACCU-CHEK AVIVA PLUS) w/Device KIT Check blood sugar up to 1 time a day as directed 05/23/15  Yes Shela Leff, MD  Blood Glucose Monitoring Suppl (ACCU-CHEK COMPACT CARE KIT) KIT Use as directed. 05/09/10  Yes Bertha Stakes, MD   Blood Glucose Monitoring Suppl (BLOOD GLUCOSE METER) kit Use to check blood glucose twice a day before breakfast and supper    Yes Historical Provider, MD  cetirizine (ZYRTEC) 10 MG tablet Take 1 tablet (10 mg total) by mouth daily. 05/23/15  Yes Shela Leff, MD  enalapril (VASOTEC) 5 MG tablet TAKE 1 TABLET BY MOUTH EVERY DAY 11/18/14  Yes Shela Leff, MD  fluticasone (FLONASE) 50 MCG/ACT nasal spray Place 2 sprays into both nostrils daily. 05/23/15  Yes Shela Leff, MD  glucose blood (ACCU-CHEK AVIVA PLUS) test strip Check blood sugar up to 1 time a day as directed 05/23/15  Yes Shela Leff, MD  hydrochlorothiazide (HYDRODIURIL) 25 MG tablet TAKE 1 TABLET BY MOUTH EVERY DAY 05/09/15  Yes Shela Leff, MD  metFORMIN (GLUCOPHAGE) 1000 MG tablet TAKE 1 TABLET (1,000 MG TOTAL) BY MOUTH 2 (TWO) TIMES DAILY WITH A MEAL. 05/23/15  Yes Shela Leff, MD  omeprazole (PRILOSEC) 20 MG capsule TAKE ONE CAPSULE BY MOUTH TWICE A DAY BEFORE A MEAL 03/27/15  Yes Axel Filler, MD  PARoxetine (PAXIL) 20 MG tablet TAKE 1 TABLET (20 MG TOTAL) BY MOUTH DAILY. 06/12/15  Yes Axel Filler, MD  ranitidine (ZANTAC) 150 MG capsule Take 1 capsule (150 mg total) by mouth every evening. 05/10/15  Yes Shela Leff, MD  traMADol (ULTRAM) 50 MG tablet Take 1 tablet (50 mg total) by mouth every 8 (eight) hours as needed. for pain 06/27/15  Yes Shela Leff, MD  valACYclovir (VALTREX) 500 MG tablet TAKE 1 TABLET (500 MG TOTAL) BY MOUTH DAILY. 05/23/15  Yes Shela Leff, MD  benzonatate (TESSALON) 100 MG capsule Take 1 capsule (100 mg total) by mouth every 8 (eight) hours. 04/08/15   April Palumbo, MD  ibuprofen (ADVIL,MOTRIN) 800 MG tablet Take 1 tablet (800 mg total) by mouth 3 (three) times daily. 04/21/15   Ozella Almond Ward, PA-C  Lancet [facility]) lancets Use as instructed 05/09/10 05/09/11  Bertha Stakes, MD  tamsulosin (FLOMAX) 0.4 MG CAPS capsule Take 2 capsules  (0.8 mg total) by mouth daily. Patient not taking: Reported on 03/14/2014    Bertha Stakes, MD   BP 115/77 mmHg  Pulse 65  Temp(Src) 98.3 F (36.8 C) (Oral)  Resp 20  Ht '5\' 8"'$  (1.727 m)  Wt 307 lb (139.254 kg)  BMI 46.69 kg/m2  SpO2 100% Physical Exam  Constitutional: He appears well-developed.  Musculoskeletal:  Mild tenderness and ecchymosis to anterior left ankle. Good range of motion. Neurovascular intact in foot. Tenderness over medial or lateral malleolus. Pulses intact.  Skin: Skin is warm.    ED Course  Procedures (including critical care time) Labs Review Labs Reviewed - No data to display  Imaging Review Dg Ankle Complete Left  08/05/2015  CLINICAL DATA:  Status post fall at [employer] 1 week ago. EXAM: LEFT ANKLE COMPLETE - 3+ VIEW COMPARISON:  None. FINDINGS: There is no evidence of fracture, dislocation, or joint effusion. Posterior calcaneal heel spur noted. There is no evidence of arthropathy or other focal bone abnormality. Mild diffuse soft tissue swelling. IMPRESSION: 1. No acute bone abnormality. 2. Soft tissue swelling. Electronically Signed   By: Kerby Moors M.D.   On: 08/05/2015 09:20   I have personally reviewed and evaluated these images and lab results as part of my medical decision-making.   EKG Interpretation None      MDM   Final diagnoses:  Ankle sprain, left, initial encounter    Patient with ankle pain after twisting it. Negative x-ray. Will give ASO and source medicine follow-up as needed.    Davonna Belling, MD 08/05/15 [phone]

## 2015-10-02 ENCOUNTER — Other Ambulatory Visit: Payer: Self-pay | Admitting: Internal Medicine

## 2015-10-03 NOTE — Telephone Encounter (Signed)
Tramadol rx called to [facility]. 

## 2015-11-16 ENCOUNTER — Telehealth: Payer: Self-pay | Admitting: Internal Medicine

## 2015-11-17 ENCOUNTER — Other Ambulatory Visit: Payer: Self-pay | Admitting: Student in an Organized Health Care Education/Training Program

## 2015-11-17 NOTE — Telephone Encounter (Signed)
Tramadol rx called to [facility] pharmacy. Also message sent to front office to schedule pt an appt.

## 2015-11-21 NOTE — Telephone Encounter (Signed)
Patient already has an appt scheduled with Dr. Marlowe Sax on 11-28-15@ 1:15 PM.  It was made back in May when he left for 6 month checkup.

## 2015-11-27 ENCOUNTER — Telehealth: Payer: Self-pay | Admitting: Internal Medicine

## 2015-11-27 NOTE — Telephone Encounter (Signed)
APT. REMINDER CALL, LMTCB °

## 2015-11-28 ENCOUNTER — Encounter: Payer: Medicaid Other | Admitting: Internal Medicine

## 2015-12-24 ENCOUNTER — Other Ambulatory Visit: Payer: Self-pay | Admitting: Internal Medicine

## 2015-12-25 NOTE — Telephone Encounter (Signed)
Has appt already scheduled for 12/19 with pcp.Carlos Bean, Carlos Beers Cassady12/4/20178:40 AM

## 2015-12-26 ENCOUNTER — Other Ambulatory Visit: Payer: Self-pay | Admitting: Internal Medicine

## 2015-12-26 NOTE — Telephone Encounter (Signed)
Phoned in.Goldston, Darlene Cassady12/5/201710:16 AM

## 2016-01-01 ENCOUNTER — Encounter: Payer: Self-pay | Admitting: Internal Medicine

## 2016-01-01 DIAGNOSIS — Z79891 Long term (current) use of opiate analgesic: Secondary | ICD-10-CM | POA: Insufficient documentation

## 2016-01-08 ENCOUNTER — Telehealth: Payer: Self-pay | Admitting: Internal Medicine

## 2016-01-08 NOTE — Telephone Encounter (Signed)
APT. REMINDER CALL, LMTCB °

## 2016-01-09 ENCOUNTER — Encounter: Payer: Self-pay | Admitting: Internal Medicine

## 2016-01-09 ENCOUNTER — Ambulatory Visit (INDEPENDENT_AMBULATORY_CARE_PROVIDER_SITE_OTHER): Payer: Medicaid Other | Admitting: Internal Medicine

## 2016-01-09 VITALS — BP 127/70 | HR 97 | Temp 98.2°F | Ht 68.0 in | Wt 314.9 lb

## 2016-01-09 DIAGNOSIS — Z87438 Personal history of other diseases of male genital organs: Secondary | ICD-10-CM

## 2016-01-09 DIAGNOSIS — E1165 Type 2 diabetes mellitus with hyperglycemia: Secondary | ICD-10-CM

## 2016-01-09 DIAGNOSIS — M47816 Spondylosis without myelopathy or radiculopathy, lumbar region: Secondary | ICD-10-CM

## 2016-01-09 DIAGNOSIS — M5136 Other intervertebral disc degeneration, lumbar region: Secondary | ICD-10-CM

## 2016-01-09 DIAGNOSIS — Z23 Encounter for immunization: Secondary | ICD-10-CM

## 2016-01-09 DIAGNOSIS — Z0000 Encounter for general adult medical examination without abnormal findings: Secondary | ICD-10-CM

## 2016-01-09 DIAGNOSIS — G8929 Other chronic pain: Secondary | ICD-10-CM

## 2016-01-09 DIAGNOSIS — M5416 Radiculopathy, lumbar region: Secondary | ICD-10-CM

## 2016-01-09 DIAGNOSIS — E119 Type 2 diabetes mellitus without complications: Secondary | ICD-10-CM

## 2016-01-09 DIAGNOSIS — Z79899 Other long term (current) drug therapy: Secondary | ICD-10-CM

## 2016-01-09 DIAGNOSIS — I10 Essential (primary) hypertension: Secondary | ICD-10-CM | POA: Diagnosis not present

## 2016-01-09 DIAGNOSIS — Z7984 Long term (current) use of oral hypoglycemic drugs: Secondary | ICD-10-CM

## 2016-01-09 DIAGNOSIS — M545 Low back pain: Secondary | ICD-10-CM

## 2016-01-09 DIAGNOSIS — R319 Hematuria, unspecified: Secondary | ICD-10-CM

## 2016-01-09 DIAGNOSIS — Z79891 Long term (current) use of opiate analgesic: Secondary | ICD-10-CM | POA: Diagnosis not present

## 2016-01-09 LAB — POCT GLYCOSYLATED HEMOGLOBIN (HGB A1C): Hemoglobin A1C: 6.9

## 2016-01-09 LAB — GLUCOSE, CAPILLARY: GLUCOSE-CAPILLARY: 172 mg/dL — AB (ref 65–99)

## 2016-01-09 MED ORDER — TRAMADOL HCL 50 MG PO TABS: 50.0000 mg | ORAL_TABLET | Freq: Three times a day (TID) | ORAL | 0 refills | Status: DC | PRN

## 2016-01-09 MED ORDER — TETANUS-DIPHTH-ACELL PERTUSSIS 5-2.5-18.5 LF-MCG/0.5 IM SUSP: 0.5000 mL | Freq: Once | INTRAMUSCULAR | Status: DC

## 2016-01-09 MED ORDER — LIRAGLUTIDE 18 MG/3ML ~~LOC~~ SOPN: PEN_INJECTOR | SUBCUTANEOUS | 3 refills | Status: DC

## 2016-01-09 MED ORDER — HYDROCHLOROTHIAZIDE 25 MG PO TABS: 25.0000 mg | ORAL_TABLET | Freq: Every day | ORAL | 3 refills | Status: DC

## 2016-01-09 NOTE — Patient Instructions (Signed)
Mr. Carlos Bean it was nice seeing you today.  -Inject Victoza 0.6 mg into the skin daily for 1 week. Then, start injecting 1.2 mg into the skin daily.   -Continue taking Metformin as before   -Continue taking your blood pressure medications as before

## 2016-01-10 LAB — BMP8+ANION GAP
Anion Gap: 20 mmol/L — ABNORMAL HIGH (ref 10.0–18.0)
BUN/Creatinine Ratio: 13 (ref 9–20)
BUN: 16 mg/dL (ref 6–24)
CALCIUM: 10.2 mg/dL (ref 8.7–10.2)
CO2: 23 mmol/L (ref 18–29)
Chloride: 96 mmol/L (ref 96–106)
Creatinine, Ser: 1.23 mg/dL (ref 0.76–1.27)
GFR, EST AFRICAN AMERICAN: 76 mL/min/{1.73_m2} (ref >59)
GFR, EST NON AFRICAN AMERICAN: 66 mL/min/{1.73_m2} (ref >59)
Glucose: 107 mg/dL — ABNORMAL HIGH (ref 65–99)
POTASSIUM: 4.1 mmol/L (ref 3.5–5.2)
SODIUM: 139 mmol/L (ref 134–144)

## 2016-01-10 NOTE — Progress Notes (Signed)
   CC: Patient is here to discuss hypertension, diabetes, and chronic lower back pain.  HPI:  Carlos Bean is a 55 y.o. male with a past medical history of conditions listed below presenting to the clinic to discuss hypertension, diabetes, and chronic lower back pain. Please see problem based charting for the status of the patient's current and chronic medical conditions.   Past Medical History:  Diagnosis Date  . Adenomatous polyp of colon 03/23/2014   Found on colonoscopy 03/14/2014 by Dr. Scarlette Shorts.   . Allergic rhinitis   . Allergy    SEASONAL  . Anemia, unspecified    Mild normocytic anemia; Hgb=12.6 on 07/03/2005  . Arthritis    BACK  . Atypical chest pain    Evaluated by Dr. Fransico Him in 2002; adenosine cardiolite was negative then.  Marland Kitchen BPH (benign prostatic hypertrophy)   . Bronchitis   . Carpal tunnel syndrome    S/P release transverse carpal ligament right wrist and decompression  median nerve right wrist by Dr. Geroge Baseman. Mortenson 02/25/2002.  Marland Kitchen Chronic low back pain   . Constipation   . Depression   . Diabetes mellitus, type 2 ([place])   . Dyspnea on exertion    Spirometry normal 02/26/2001  . Elevated transaminase level    Mildly elevated SGPT  . Genital herpes   . GERD (gastroesophageal reflux disease)   . Hematochezia    Referred to GI 05/17/2009, missed appointment.  . Hyperlipemia   . Hypertension   . Irregular heart beat   . Obesity   . Sigmoid diverticulosis 03/25/2014   Mild diverticulosis was noted in the sigmoid colon on colonoscopy by Dr. Henrene Pastor 03/14/2014   . Sinusitis 06/2011  . Tonsillitis 10/07/2008  . Unspecified disorder of male genital organs    Patient reports occasional bleeding in small amount from area of skin irritation on scrotum, none recent; exam 04/26/2009 normal.    Review of Systems:   Review of Systems  Constitutional: Negative for chills, fever and malaise/fatigue.  Respiratory: Negative for cough and shortness of breath.     Cardiovascular: Negative for chest pain and leg swelling.  Gastrointestinal: Negative for abdominal pain, nausea and vomiting.    Physical Exam:  Vitals:   01/09/16 1504 01/09/16 1626  BP: (!) 141/58 127/70  Pulse: (!) 109 97  Temp: 98.2 F (36.8 C)   TempSrc: Oral   SpO2: 100%   Weight: (!) 314 lb 14.4 oz (142.8 kg)   Height: 5\' 8"  (1.727 m)    Physical Exam  Constitutional: He is oriented to person, place, and time. No distress.  Morbidly obese  HENT:  Head: Normocephalic and atraumatic.  Mouth/Throat: Oropharynx is clear and moist.  Eyes: EOM are normal.  Neck: Neck supple. No tracheal deviation present.  Cardiovascular: Normal rate, regular rhythm and intact distal pulses.   Pulmonary/Chest: Effort normal and breath sounds normal. No respiratory distress. He has no wheezes. He has no rales.  Abdominal: Soft. Bowel sounds are normal. He exhibits no distension. There is no tenderness. There is no guarding.  Musculoskeletal: He exhibits no edema.  Neurological: He is alert and oriented to person, place, and time.  Skin: Skin is warm and dry.    Assessment & Plan:   See Encounters Tab for problem based charting.  Patient discussed with Dr. Daryll Drown

## 2016-01-10 NOTE — Assessment & Plan Note (Signed)
Resolved. Patient denies noticing any bleeding from his scrotal region or penis.

## 2016-01-10 NOTE — Assessment & Plan Note (Signed)
BP Readings from Last 3 Encounters:  01/09/16 127/70  08/05/15 115/77  07/22/15 163/92    Lab Results  Component Value Date   NA 139 01/09/2016   K 4.1 01/09/2016   CREATININE 1.23 01/09/2016    Assessment: Blood pressure control: Below goal  Comments: Current medication regimen includes hydrochlorothiazide 25 mg daily and enalapril 5 mg daily.  Plan: Medications:  continue current medications Educational resources provided: Educated patient about healthy eating and exercise. Emphasized the importance of weight loss.

## 2016-01-10 NOTE — Assessment & Plan Note (Signed)
Tetanus vaccine administered at this visit. Patient denies flu vaccine.

## 2016-01-10 NOTE — Assessment & Plan Note (Addendum)
Lab Results  Component Value Date   HGBA1C 6.9 01/09/2016   HGBA1C 6.9 05/23/2015   HGBA1C 6.2 10/25/2014     Assessment: Diabetes control:  below goal Progress toward A1C goal:   unchanged Comments: Current medication regimen includes metformin 1000 mg twice daily. Patient has gained 7 pounds since his last visit in July. Serum creatinine checked at this visit is 1.2, baseline 1.0-1.1. Anion gap elevated at 20, however, bicarbonate is normal (23) and CBG 172. In addition, patient did not endorse any symptoms of DKA.  Plan: Medications:  Start Victoza 0.6 mg daily for 1 week, then 1.2 mg daily. Continue metformin as above. Instruction/counseling given: reminded to get eye exam, reminded to bring medications to each visit, discussed foot care, discussed the need for weight loss, discussed diet and discussed sick day management Other plans:  -Repeat A1c in 3 months. If no improvement in A1c, consider increasing the dose of Victoza to 1.8 mg daily.  Addendum 01/11/16: Patient's insurance does not cover Victoza. Switching to Byetta 5 mcg BID with meals.

## 2016-01-10 NOTE — Assessment & Plan Note (Signed)
Assessment Patient has a history of chronic lower back pain secondary to lumbar spondylosis, degenerative disc disease, prominent nerve impingement at L3-4 and L4-5, and moderate impingement at L2-3 and L5-S1. He is requesting a refill on tramadol stating it helps him function better and helps control his pain. Patient states he was scheduled to see a pain management specialist in [place] but was not able to go because he has now moved to [place] and is requesting another referral, specifically to the pain management clinic at [facility].  Plan -Refill tramadol 50 mg every 8 hours as needed #60 -Referral to pain management

## 2016-01-11 MED ORDER — EXENATIDE 5 MCG/0.02ML ~~LOC~~ SOPN: 5.0000 ug | PEN_INJECTOR | Freq: Two times a day (BID) | SUBCUTANEOUS | 5 refills | Status: DC

## 2016-01-12 NOTE — Progress Notes (Signed)
Internal Medicine Clinic Attending  Case discussed with Dr. Rathore soon after the resident saw the patient.  We reviewed the resident's history and exam and pertinent patient test results.  I agree with the assessment, diagnosis, and plan of care documented in the resident's note.  

## 2016-01-17 ENCOUNTER — Other Ambulatory Visit: Payer: Self-pay | Admitting: Dietician

## 2016-01-17 DIAGNOSIS — E119 Type 2 diabetes mellitus without complications: Secondary | ICD-10-CM

## 2016-01-17 MED ORDER — PEN NEEDLES 31G X 5 MM MISC: 1.0000 | Freq: Two times a day (BID) | 12 refills | Status: AC

## 2016-01-17 NOTE — Telephone Encounter (Signed)
Patient's wife called asking for pen needles of victoza. Note that his insurance does ot cover the Victoza, sot he doctor ordered Byetta instead.

## 2016-01-27 ENCOUNTER — Other Ambulatory Visit: Payer: Self-pay | Admitting: Student in an Organized Health Care Education/Training Program

## 2016-01-31 ENCOUNTER — Other Ambulatory Visit: Payer: Self-pay | Admitting: Internal Medicine

## 2016-01-31 DIAGNOSIS — J309 Allergic rhinitis, unspecified: Secondary | ICD-10-CM

## 2016-02-01 ENCOUNTER — Other Ambulatory Visit: Payer: Self-pay

## 2016-02-01 DIAGNOSIS — M545 Low back pain: Principal | ICD-10-CM

## 2016-02-01 DIAGNOSIS — G8929 Other chronic pain: Secondary | ICD-10-CM

## 2016-02-01 NOTE — Telephone Encounter (Signed)
traMADol (ULTRAM) 50 MG tablet, refill request.  ° °

## 2016-02-02 MED ORDER — TRAMADOL HCL 50 MG PO TABS: 50.0000 mg | ORAL_TABLET | Freq: Three times a day (TID) | ORAL | 0 refills | Status: DC | PRN

## 2016-02-03 ENCOUNTER — Encounter (HOSPITAL_BASED_OUTPATIENT_CLINIC_OR_DEPARTMENT_OTHER): Payer: Self-pay | Admitting: Emergency Medicine

## 2016-02-03 ENCOUNTER — Emergency Department (HOSPITAL_BASED_OUTPATIENT_CLINIC_OR_DEPARTMENT_OTHER)
Admission: EM | Admit: 2016-02-03 | Discharge: 2016-02-03 | Disposition: A | Discharge status: Home / Self Care | Payer: Medicaid Other | Attending: Emergency Medicine | Admitting: Emergency Medicine

## 2016-02-03 ENCOUNTER — Emergency Department (HOSPITAL_BASED_OUTPATIENT_CLINIC_OR_DEPARTMENT_OTHER): Payer: Medicaid Other

## 2016-02-03 DIAGNOSIS — J40 Bronchitis, not specified as acute or chronic: Secondary | ICD-10-CM | POA: Insufficient documentation

## 2016-02-03 DIAGNOSIS — E119 Type 2 diabetes mellitus without complications: Secondary | ICD-10-CM | POA: Diagnosis not present

## 2016-02-03 DIAGNOSIS — Z7982 Long term (current) use of aspirin: Secondary | ICD-10-CM | POA: Insufficient documentation

## 2016-02-03 DIAGNOSIS — Z794 Long term (current) use of insulin: Secondary | ICD-10-CM | POA: Insufficient documentation

## 2016-02-03 DIAGNOSIS — I10 Essential (primary) hypertension: Secondary | ICD-10-CM | POA: Insufficient documentation

## 2016-02-03 DIAGNOSIS — R0789 Other chest pain: Secondary | ICD-10-CM

## 2016-02-03 DIAGNOSIS — R05 Cough: Secondary | ICD-10-CM

## 2016-02-03 DIAGNOSIS — Z79899 Other long term (current) drug therapy: Secondary | ICD-10-CM | POA: Diagnosis not present

## 2016-02-03 DIAGNOSIS — R059 Cough, unspecified: Secondary | ICD-10-CM

## 2016-02-03 DIAGNOSIS — Z87891 Personal history of nicotine dependence: Secondary | ICD-10-CM | POA: Diagnosis not present

## 2016-02-03 LAB — CBC
HCT: 40.1 % (ref 39.0–52.0)
Hemoglobin: 12.5 g/dL — ABNORMAL LOW (ref 13.0–17.0)
MCH: 29 pg (ref 26.0–34.0)
MCHC: 31.2 g/dL (ref 30.0–36.0)
MCV: 93 fL (ref 78.0–100.0)
Platelets: 294 K/uL (ref 150–400)
RBC: 4.31 MIL/uL (ref 4.22–5.81)
RDW: 12.9 % (ref 11.5–15.5)
WBC: 7 K/uL (ref 4.0–10.5)

## 2016-02-03 LAB — BASIC METABOLIC PANEL
Anion gap: 10 (ref 5–15)
BUN: 17 mg/dL (ref 6–20)
CO2: 26 mmol/L (ref 22–32)
Calcium: 9.9 mg/dL (ref 8.9–10.3)
Chloride: 101 mmol/L (ref 101–111)
Creatinine, Ser: 1.17 mg/dL (ref 0.61–1.24)
GFR calc Af Amer: >60 mL/min (ref >60)
GFR calc non Af Amer: >60 mL/min (ref >60)
Glucose, Bld: 106 mg/dL — ABNORMAL HIGH (ref 65–99)
Potassium: 4.1 mmol/L (ref 3.5–5.1)
Sodium: 137 mmol/L (ref 135–145)

## 2016-02-03 LAB — TROPONIN I: Troponin I: <0.03 ng/mL (ref <0.03)

## 2016-02-03 MED ORDER — PREDNISONE 20 MG PO TABS: ORAL_TABLET | ORAL | 0 refills | Status: DC

## 2016-02-03 MED ORDER — ALBUTEROL SULFATE (2.5 MG/3ML) 0.083% IN NEBU
5.0000 mg | INHALATION_SOLUTION | Freq: Once | RESPIRATORY_TRACT | Status: AC
  Administered 2016-02-03: 5 mg via RESPIRATORY_TRACT
  Filled 2016-02-03: qty 6

## 2016-02-03 NOTE — ED Notes (Signed)
C/o CP on and off for 2 weeks. Also reports mixing ammonia and bleach together last night. Also reports has been dealing with bronchitis, cough and congestion. Has been taking mucinex and using inhalers with spacer. Thinks "nebulizer would work better because it works for the kids". Pt alert, NAD, calm, interactive, resps e/u, speaking in clear complete sentences, no dyspnea noted, VSS. EDNP into room.
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
## 2016-02-03 NOTE — ED Notes (Signed)
RT at BS.

## 2016-02-03 NOTE — ED Triage Notes (Signed)
Pt with chest pain on and off for a couple of days. Pt also inhaled a mixture of bleach and ammonia last night while cleaning up. Pt alert and oriented.

## 2016-02-03 NOTE — ED Provider Notes (Signed)
EKG:  Rhythm: normal sinus with sinus arrhythmia Rate: 90 PR: 150 ms QRS: 82 ms QTc: 423 ms St segments: normal    Carlos Manifold, MD 02/03/16 2325

## 2016-02-06 NOTE — Telephone Encounter (Signed)
Called to pharm 

## 2016-02-25 ENCOUNTER — Other Ambulatory Visit: Payer: Self-pay | Admitting: Internal Medicine

## 2016-03-06 ENCOUNTER — Other Ambulatory Visit: Payer: Self-pay

## 2016-03-06 DIAGNOSIS — M545 Low back pain: Principal | ICD-10-CM

## 2016-03-06 DIAGNOSIS — G8929 Other chronic pain: Secondary | ICD-10-CM

## 2016-03-06 MED ORDER — TRAMADOL HCL 50 MG PO TABS: 50.0000 mg | ORAL_TABLET | Freq: Three times a day (TID) | ORAL | 0 refills | Status: AC | PRN

## 2016-03-06 NOTE — Telephone Encounter (Signed)
traMADol (ULTRAM) 50 MG tablet, refill request.  ° °

## 2016-03-07 NOTE — Telephone Encounter (Signed)
Called pt and ask him to call and reschedule his appt w/ bethany, he agreed, he got tramadol filled yesterday, it was a script that was on hold, this new one will be put on hold and not filled for 30 days

## 2016-03-26 ENCOUNTER — Encounter: Payer: Self-pay | Admitting: Internal Medicine

## 2016-04-08 ENCOUNTER — Telehealth: Payer: Self-pay | Admitting: Internal Medicine

## 2016-04-08 NOTE — Telephone Encounter (Signed)
APT. REMINDER CALL, LMTCB °

## 2016-04-09 ENCOUNTER — Encounter: Payer: Self-pay | Admitting: Internal Medicine

## 2016-04-09 ENCOUNTER — Ambulatory Visit (INDEPENDENT_AMBULATORY_CARE_PROVIDER_SITE_OTHER): Payer: Medicaid Other | Admitting: Internal Medicine

## 2016-04-09 VITALS — BP 129/76 | HR 83 | Temp 98.6°F | Wt 314.4 lb

## 2016-04-09 DIAGNOSIS — E119 Type 2 diabetes mellitus without complications: Secondary | ICD-10-CM

## 2016-04-09 DIAGNOSIS — K219 Gastro-esophageal reflux disease without esophagitis: Secondary | ICD-10-CM

## 2016-04-09 DIAGNOSIS — H9201 Otalgia, right ear: Secondary | ICD-10-CM | POA: Diagnosis present

## 2016-04-09 DIAGNOSIS — N400 Enlarged prostate without lower urinary tract symptoms: Secondary | ICD-10-CM | POA: Diagnosis not present

## 2016-04-09 DIAGNOSIS — Z79899 Other long term (current) drug therapy: Secondary | ICD-10-CM | POA: Diagnosis not present

## 2016-04-09 DIAGNOSIS — Z87891 Personal history of nicotine dependence: Secondary | ICD-10-CM

## 2016-04-09 DIAGNOSIS — M545 Low back pain: Secondary | ICD-10-CM

## 2016-04-09 DIAGNOSIS — Z0000 Encounter for general adult medical examination without abnormal findings: Secondary | ICD-10-CM

## 2016-04-09 DIAGNOSIS — Z7984 Long term (current) use of oral hypoglycemic drugs: Secondary | ICD-10-CM | POA: Diagnosis not present

## 2016-04-09 DIAGNOSIS — G8929 Other chronic pain: Secondary | ICD-10-CM | POA: Diagnosis not present

## 2016-04-09 DIAGNOSIS — E785 Hyperlipidemia, unspecified: Secondary | ICD-10-CM | POA: Diagnosis not present

## 2016-04-09 DIAGNOSIS — Z6841 Body Mass Index (BMI) 40.0 and over, adult: Secondary | ICD-10-CM

## 2016-04-09 DIAGNOSIS — I10 Essential (primary) hypertension: Secondary | ICD-10-CM | POA: Diagnosis not present

## 2016-04-09 LAB — POCT GLYCOSYLATED HEMOGLOBIN (HGB A1C): HEMOGLOBIN A1C: 5.9

## 2016-04-09 LAB — GLUCOSE, CAPILLARY: Glucose-Capillary: 101 mg/dL — ABNORMAL HIGH (ref 65–99)

## 2016-04-09 MED ORDER — ATORVASTATIN CALCIUM 40 MG PO TABS: 40.0000 mg | ORAL_TABLET | Freq: Every day | ORAL | 3 refills | Status: DC

## 2016-04-09 MED ORDER — FINASTERIDE 5 MG PO TABS
5.0000 mg | ORAL_TABLET | Freq: Every day | ORAL | 3 refills | Status: DC
Start: 2016-04-09 — End: 2016-07-09

## 2016-04-09 MED ORDER — RANITIDINE HCL 150 MG PO CAPS: 150.0000 mg | ORAL_CAPSULE | Freq: Every evening | ORAL | 3 refills | Status: AC

## 2016-04-09 MED ORDER — ENALAPRIL MALEATE 5 MG PO TABS: 5.0000 mg | ORAL_TABLET | Freq: Every day | ORAL | 3 refills | Status: AC

## 2016-04-09 MED ORDER — OMEPRAZOLE 20 MG PO CPDR: DELAYED_RELEASE_CAPSULE | ORAL | 3 refills | Status: AC

## 2016-04-09 MED ORDER — LIRAGLUTIDE 18 MG/3ML ~~LOC~~ SOPN: 1.2000 mg | PEN_INJECTOR | Freq: Every day | SUBCUTANEOUS | 6 refills | Status: DC

## 2016-04-09 NOTE — Patient Instructions (Signed)
Keep using your blood pressure and diabetes medicines as before.  I encourage you to eat healthy and exercise.

## 2016-04-10 DIAGNOSIS — H9201 Otalgia, right ear: Secondary | ICD-10-CM | POA: Insufficient documentation

## 2016-04-10 NOTE — Assessment & Plan Note (Signed)
Followed by Dr. Estill Dooms. Finasteride refilled at this visit.

## 2016-04-10 NOTE — Assessment & Plan Note (Signed)
Patient is being followed by pain management (Dr. Hubbard Robinson at [facility]).

## 2016-04-10 NOTE — Assessment & Plan Note (Signed)
BP Readings from Last 3 Encounters:  04/09/16 129/76  02/03/16 (!) 118/53  01/09/16 127/70    Lab Results  Component Value Date   NA 137 02/03/2016   K 4.1 02/03/2016   CREATININE 1.17 02/03/2016    Assessment: Blood pressure control:  well-controlled Comments: Currently on hydrochlorothiazide 25 mg daily and enalapril 5 mg daily.  Plan: Medications:  continue current medications Educational resources provided:   Educated patient about healthy eating and exercise. Emphasized the importance of weight loss.

## 2016-04-10 NOTE — Progress Notes (Addendum)
CC: Patient is complaining of pain in his right ear. Hypertension, diabetes, and chronic back pain were also discussed during this visit.  HPI:  [name] is a 56 y.o. male with a past medical history of conditions listed below presenting to the clinic complaining of pain in his right ear. Hypertension, diabetes, and chronic back pain were also discussed during this visit. Please see problem based charting for the status of the patient's current and chronic medical conditions.   Past Medical History:  Diagnosis Date  . Adenomatous polyp of colon 03/23/2014   Found on colonoscopy 03/14/2014 by Dr. Scarlette Shorts.   . Allergic rhinitis   . Allergy    SEASONAL  . Anemia, unspecified    Mild normocytic anemia; Hgb=12.6 on 07/03/2005  . Arthritis    BACK  . Atypical chest pain    Evaluated by Dr. Fransico Him in 2002; adenosine cardiolite was negative then.  Marland Kitchen BPH (benign prostatic hypertrophy)   . Bronchitis   . Carpal tunnel syndrome    S/P release transverse carpal ligament right wrist and decompression  median nerve right wrist by Dr. Geroge Baseman. Mortenson 02/25/2002.  Marland Kitchen Chronic low back pain   . Constipation   . Depression   . Diabetes mellitus, type 2 ([place])   . Dyspnea on exertion    Spirometry normal 02/26/2001  . Elevated transaminase level    Mildly elevated SGPT  . Genital herpes   . GERD (gastroesophageal reflux disease)   . Hematochezia    Referred to GI 05/17/2009, missed appointment.  . Hyperlipemia   . Hypertension   . Irregular heart beat   . Obesity   . Sigmoid diverticulosis 03/25/2014   Mild diverticulosis was noted in the sigmoid colon on colonoscopy by Dr. Henrene Pastor 03/14/2014   . Sinusitis 06/2011  . Tonsillitis 10/07/2008  . Unspecified disorder of male genital organs    Patient reports occasional bleeding in small amount from area of skin irritation on scrotum, none recent; exam 04/26/2009 normal.    Review of Systems:  Review of Systems  Constitutional:  Negative for chills and fever.  HENT: Negative for congestion, ear discharge, ear pain, sinus pain and sore throat.   Respiratory: Negative for cough and shortness of breath.   Cardiovascular: Negative for chest pain and palpitations.  Gastrointestinal: Negative for abdominal pain, nausea and vomiting.  Skin: Negative for itching and rash.    Physical Exam:  Vitals:   04/09/16 1348 04/09/16 1446  BP: (!) 143/73 129/76  Pulse: 88 83  Temp: 98.6 F (37 C)   TempSrc: Oral   SpO2: 99%   Weight: (!) 314 lb 6.4 oz (142.6 kg)    Physical Exam  Constitutional: He is oriented to person, place, and time. He appears well-developed and well-nourished. No distress.  HENT:  Head: Normocephalic and atraumatic.  Ears bilaterally: No pain upon palpation of tragus or traction to the pinna. Minimal cerumen deposition. Tympanic membrane is pearly white and light reflex present.  Eyes: EOM are normal.  Neck: Neck supple. No tracheal deviation present.  Cardiovascular: Normal rate, regular rhythm and intact distal pulses.   Pulmonary/Chest: Effort normal and breath sounds normal. No respiratory distress.  Abdominal: Soft. Bowel sounds are normal. He exhibits no distension. There is no tenderness.  Musculoskeletal: Normal range of motion. He exhibits no edema.  Neurological: He is alert and oriented to person, place, and time.  Skin: Skin is warm and dry.    Assessment & Plan:  See Encounters Tab for problem based charting.  Patient discussed with Dr. Angelia Mould

## 2016-04-10 NOTE — Assessment & Plan Note (Signed)
Atorvastatin refilled at this visit.

## 2016-04-10 NOTE — Assessment & Plan Note (Addendum)
Assessment Patient reports having sharp pain in his right ear that started in the morning prior to this appointment. Denies having any ear pain at present. States he cleaned his ears with a bobby pin a few days ago. States his hearing is good. He denies having any fevers, chills, sore throat, nasal congestion, or sinus pain. Does report having chronic allergies which are well controlled with cetirizine. No abnormalities were noted on otoscopic examination of his ears.   Plan -Advised patient to call the clinic if he continues to experience a ear pain or if the pain becomes worse -Advised him not to use Q-tips or any sharp objects to clean his ears -Advised him to clean this ears gently with a soft towel after he showers. Refrain from inserting any objects into the ear canal.

## 2016-04-10 NOTE — Assessment & Plan Note (Addendum)
Lab Results  Component Value Date   HGBA1C 5.9 04/09/2016   HGBA1C 6.9 01/09/2016   HGBA1C 6.9 05/23/2015     Assessment: Diabetes control:  well-controlled Comments: Currently on metformin 1000 mg twice daily and Victoza 1.2 mg daily.  Plan: Medications:  continue current medications Instruction/counseling given: reminded to get eye exam, reminded to bring blood glucose meter & log to each visit, reminded to bring medications to each visit, discussed foot care, discussed the need for weight loss, discussed diet and discussed sick day management Other plans:  -Repeat A1c in 6 months -Referral to ophthalmology

## 2016-04-10 NOTE — Assessment & Plan Note (Signed)
Assessment Patient continues to lead a sedentary lifestyle. His weight has been stable at 314 pounds in the past 2 months.  Plan -Educated patient about healthy eating and exercise. Emphasized the importance of weight loss.

## 2016-04-10 NOTE — Assessment & Plan Note (Signed)
Assessment Stable. Patient is requesting refills on omeprazole and ranitidine.  Plan -Refilled omeprazole 20 mg twice daily -Refilled ranitidine 150 mg daily

## 2016-04-10 NOTE — Assessment & Plan Note (Addendum)
Patient declines influenza vaccine at this visit.

## 2016-04-15 NOTE — Progress Notes (Signed)
Internal Medicine Clinic Attending  Case discussed with Dr. Rathoreat the time of the visit. We reviewed the resident's history and exam and pertinent patient test results. I agree with the assessment, diagnosis, and plan of care documented in the resident's note.  

## 2016-05-20 ENCOUNTER — Other Ambulatory Visit: Payer: Self-pay | Admitting: Internal Medicine

## 2016-05-22 ENCOUNTER — Other Ambulatory Visit: Payer: Self-pay

## 2016-05-22 DIAGNOSIS — E119 Type 2 diabetes mellitus without complications: Secondary | ICD-10-CM

## 2016-05-22 NOTE — Telephone Encounter (Signed)
[name] from [facility] requesting metFORMIN (GLUCOPHAGE) 1000 MG tablet to be filled.

## 2016-06-03 ENCOUNTER — Other Ambulatory Visit: Payer: Self-pay | Admitting: Internal Medicine

## 2016-06-03 DIAGNOSIS — J309 Allergic rhinitis, unspecified: Secondary | ICD-10-CM

## 2016-06-29 ENCOUNTER — Other Ambulatory Visit: Payer: Self-pay | Admitting: Internal Medicine

## 2016-07-08 ENCOUNTER — Other Ambulatory Visit: Payer: Self-pay | Admitting: Internal Medicine

## 2016-07-08 DIAGNOSIS — A6000 Herpesviral infection of urogenital system, unspecified: Secondary | ICD-10-CM

## 2016-07-25 ENCOUNTER — Other Ambulatory Visit: Payer: Self-pay | Admitting: Internal Medicine

## 2016-07-25 DIAGNOSIS — J309 Allergic rhinitis, unspecified: Secondary | ICD-10-CM

## 2016-07-29 NOTE — Telephone Encounter (Signed)
   albuterol (PROVENTIL HFA) 108 (90 Base) MCG/ACT inhaler,  fluticasone (FLONASE) 50 MCG/ACT nasal spray, refill request @ [facility].

## 2016-07-30 NOTE — Addendum Note (Signed)
Addended by: Hulan Fray on: 07/30/2016 06:36 PM   Modules accepted: Orders
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
## 2016-10-08 ENCOUNTER — Encounter: Payer: Medicaid Other | Admitting: Internal Medicine

## 2016-11-02 ENCOUNTER — Emergency Department (HOSPITAL_BASED_OUTPATIENT_CLINIC_OR_DEPARTMENT_OTHER)
Admission: EM | Admit: 2016-11-02 | Discharge: 2016-11-02 | Disposition: A | Discharge status: Home / Self Care | Payer: Medicaid Other | Attending: Emergency Medicine | Admitting: Emergency Medicine

## 2016-11-02 ENCOUNTER — Encounter (HOSPITAL_BASED_OUTPATIENT_CLINIC_OR_DEPARTMENT_OTHER): Payer: Self-pay | Admitting: Emergency Medicine

## 2016-11-02 ENCOUNTER — Emergency Department (HOSPITAL_BASED_OUTPATIENT_CLINIC_OR_DEPARTMENT_OTHER): Payer: Medicaid Other

## 2016-11-02 DIAGNOSIS — R51 Headache: Secondary | ICD-10-CM | POA: Insufficient documentation

## 2016-11-02 DIAGNOSIS — Z7982 Long term (current) use of aspirin: Secondary | ICD-10-CM | POA: Diagnosis not present

## 2016-11-02 DIAGNOSIS — I10 Essential (primary) hypertension: Secondary | ICD-10-CM | POA: Diagnosis not present

## 2016-11-02 DIAGNOSIS — Z794 Long term (current) use of insulin: Secondary | ICD-10-CM | POA: Diagnosis not present

## 2016-11-02 DIAGNOSIS — R519 Headache, unspecified: Secondary | ICD-10-CM

## 2016-11-02 DIAGNOSIS — F329 Major depressive disorder, single episode, unspecified: Secondary | ICD-10-CM | POA: Diagnosis not present

## 2016-11-02 DIAGNOSIS — E119 Type 2 diabetes mellitus without complications: Secondary | ICD-10-CM | POA: Diagnosis not present

## 2016-11-02 DIAGNOSIS — Z79899 Other long term (current) drug therapy: Secondary | ICD-10-CM | POA: Insufficient documentation

## 2016-11-02 LAB — CBG MONITORING, ED: Glucose-Capillary: 93 mg/dL (ref 65–99)

## 2016-11-02 MED ORDER — KETOROLAC TROMETHAMINE 15 MG/ML IJ SOLN
15.0000 mg | Freq: Once | INTRAMUSCULAR | Status: AC
  Administered 2016-11-02: 15 mg via INTRAVENOUS
  Filled 2016-11-02: qty 1

## 2016-11-02 MED ORDER — DIPHENHYDRAMINE HCL 50 MG/ML IJ SOLN
25.0000 mg | Freq: Once | INTRAMUSCULAR | Status: AC
Start: 2016-11-02 — End: 2016-11-02
  Administered 2016-11-02: 25 mg via INTRAVENOUS
  Filled 2016-11-02: qty 1

## 2016-11-02 MED ORDER — SODIUM CHLORIDE 0.9 % IV BOLUS (SEPSIS)
1000.0000 mL | Freq: Once | INTRAVENOUS | Status: AC
  Administered 2016-11-02: 1000 mL via INTRAVENOUS

## 2016-11-02 MED ORDER — METOCLOPRAMIDE HCL 5 MG/ML IJ SOLN
10.0000 mg | Freq: Once | INTRAMUSCULAR | Status: AC
  Administered 2016-11-02: 10 mg via INTRAVENOUS
  Filled 2016-11-02: qty 2

## 2016-11-02 NOTE — ED Triage Notes (Signed)
Patient states that he has had a headache x 2 hours. He reports that he is a diabetic and has not been checking his sugar at home. THe CBG in triage was 18 - patient is a/o x 3

## 2016-11-02 NOTE — ED Notes (Signed)
ED Provider at bedside. 

## 2016-11-02 NOTE — ED Provider Notes (Signed)
[facility] DEPT [facility] Provider Note   CSN: [id number] Arrival date & time: 11/02/16  1805     History   Chief Complaint Chief Complaint  Patient presents with  . Headache    HPI Carlos Bean is a 56 y.o. male.  HPI  56 year old male presents with a headache. Started about 2 hours prior to arrival. He was in the pharmacy picking up medicines when started. It was not bad when it started but has gradually worsened. Now it is more severe. It is right-sided over his for head and face. No ocular pain area has had some mild blurry vision when trying to read since the headache started. Headache feels like a throbbing and pulsating pain. Some nausea. No real dizziness or neck stiffness. No fevers. No weakness or numbness.  Past Medical History:  Diagnosis Date  . Adenomatous polyp of colon 03/23/2014   Found on colonoscopy 03/14/2014 by Dr. Scarlette Shorts.   . Allergic rhinitis   . Allergy    SEASONAL  . Anemia, unspecified    Mild normocytic anemia; Hgb=12.6 on 07/03/2005  . Arthritis    BACK  . Atypical chest pain    Evaluated by Dr. Fransico Him in 2002; adenosine cardiolite was negative then.  Marland Kitchen BPH (benign prostatic hypertrophy)   . Bronchitis   . Carpal tunnel syndrome    S/P release transverse carpal ligament right wrist and decompression  median nerve right wrist by Dr. Geroge Baseman. Mortenson 02/25/2002.  Marland Kitchen Chronic low back pain   . Constipation   . Depression   . Diabetes mellitus, type 2 ([place])   . Dyspnea on exertion    Spirometry normal 02/26/2001  . Elevated transaminase level    Mildly elevated SGPT  . Genital herpes   . GERD (gastroesophageal reflux disease)   . Hematochezia    Referred to GI 05/17/2009, missed appointment.  . Hyperlipemia   . Hypertension   . Irregular heart beat   . Obesity   . Sigmoid diverticulosis 03/25/2014   Mild diverticulosis was noted in the sigmoid colon on colonoscopy by Dr. Henrene Pastor 03/14/2014   . Sinusitis 06/2011  . Tonsillitis  10/07/2008  . Unspecified disorder of male genital organs    Patient reports occasional bleeding in small amount from area of skin irritation on scrotum, none recent; exam 04/26/2009 normal.    Patient Active Problem List   Diagnosis Date Noted  . Morbid obesity ([place]) 04/10/2016  . Ear pain, right 04/10/2016  . Long term current use of opiate analgesic 01/01/2016  . Sigmoid diverticulosis 03/25/2014  . Adenomatous polyp of colon 03/23/2014  . Preventative health care 08/19/2012  . BPH (benign prostatic hyperplasia) 03/13/2011  . Anemia 04/27/2009  . Genital herpes 06/28/2008  . Hyperlipidemia 10/28/2007  . Diabetes mellitus type II, controlled ([place]) 10/14/2007  . Allergic rhinitis 02/10/2006  . Depression 12/04/2005  . Essential hypertension 12/04/2005  . GERD 12/04/2005  . LOW BACK PAIN, CHRONIC 12/04/2005    Past Surgical History:  Procedure Laterality Date  . CARPAL TUNNEL RELEASE  02/25/2002   S/P release transverse carpal ligament right wrist and decompression  median nerve right wrist by Dr. Geroge Baseman. Mortenson 02/25/2002.  Marland Kitchen epidural sterior injections     spine       Home Medications    Prior to Admission medications   Medication Sig Start Date End Date Taking? Authorizing Provider  aspirin EC 81 MG tablet Take 81 mg by mouth daily.    [provider]  atorvastatin (LIPITOR) 40 MG tablet Take 1 tablet (40 mg total) by mouth daily. 04/09/16   Shela Leff, MD  cetirizine (ZYRTEC) 10 MG tablet TAKE 1 TABLET (10 MG TOTAL) BY MOUTH DAILY. 06/05/16   Shela Leff, MD  enalapril (VASOTEC) 5 MG tablet Take 1 tablet (5 mg total) by mouth daily. 04/09/16   Shela Leff, MD  finasteride (PROSCAR) 5 MG tablet TAKE 1 TABLET BY MOUTH DAILY 07/09/16   Shela Leff, MD  fluticasone [facility]) 50 MCG/ACT nasal spray INSTILL 2 SPRAYS INTO EACH NOSTRIL DAILY 07/30/16   Shela Leff, MD  glucose blood test strip 1 each by Other route as needed for  other. Use as instructed    [provider]  hydrochlorothiazide (HYDRODIURIL) 25 MG tablet Take 1 tablet (25 mg total) by mouth daily. 01/09/16   Shela Leff, MD  Insulin Pen Needle (PEN NEEDLES) 31G X 5 MM MISC 1 each by Does not apply route 2 (two) times daily. 01/17/16   Sid Falcon, MD  liraglutide (VICTOZA) 18 MG/3ML SOPN Inject 0.2 mLs (1.2 mg total) into the skin daily. 04/09/16   Shela Leff, MD  metFORMIN (GLUCOPHAGE) 1000 MG tablet TAKE 1 TABLET (1,000 MG TOTAL) BY MOUTH 2 (TWO) TIMES DAILY WITH A MEAL. 05/23/15   Shela Leff, MD  metFORMIN (GLUCOPHAGE) 1000 MG tablet TAKE 1 TABLET (1,000 MG TOTAL) BY MOUTH 2 (TWO) TIMES DAILY WITH A MEAL. 05/22/16   Shela Leff, MD  omeprazole (PRILOSEC) 20 MG capsule TAKE ONE CAPSULE BY MOUTH TWICE A DAY BEFORE A MEAL 04/09/16   Shela Leff, MD  PARoxetine (PAXIL) 20 MG tablet TAKE 1 TABLET (20 MG TOTAL) BY MOUTH DAILY. 07/09/16   Shela Leff, MD  PROVENTIL HFA 108 (90 Base) MCG/ACT inhaler INHALE 2 PUFFS INTO THE LUNGS 4 TIMES A DAY AS NEEDED FOR WHEEZING OR SHORTNESS OF BREATH 07/30/16   Shela Leff, MD  ranitidine (ZANTAC) 150 MG capsule Take 1 capsule (150 mg total) by mouth every evening. 04/09/16   Shela Leff, MD  traMADol (ULTRAM) 50 MG tablet Take 1 tablet (50 mg total) by mouth every 8 (eight) hours as needed. for pain. 03/06/16   Shela Leff, MD  valACYclovir (VALTREX) 500 MG tablet TAKE 1 TABLET (500 MG TOTAL) BY MOUTH DAILY. 07/09/16   Shela Leff, MD    Family History Family History  Problem Relation Age of Onset  . Breast cancer Sister        2 sisters with breast cancer  . Prostate cancer Brother 22  . Colon polyps Brother   . Diabetes type II Mother   . Kidney failure Mother   . Stroke Father        Late 36s  . Colon cancer Paternal Aunt 62    Social History Social History  Substance Use Topics  . Smoking status: Former Smoker    Packs/day: 0.50     Years: 5.00    Quit date: 01/21/1990  . Smokeless tobacco: Never Used  . Alcohol use No     Allergies   Clarithromycin   Review of Systems Review of Systems  Eyes: Positive for photophobia and visual disturbance. Negative for pain.  Gastrointestinal: Positive for nausea.  Musculoskeletal: Negative for neck stiffness.  Neurological: Positive for headaches. Negative for dizziness, weakness and numbness.  All other systems reviewed and are negative.    Physical Exam Updated Vital Signs BP (!) 142/87 (BP Location: Right Wrist)   Pulse 80   Temp 97.7 F (36.5 C) (  Oral)   Resp 18   Ht 5\' 8"  (1.727 m)   Wt (!) 145.2 kg (320 lb)   SpO2 93%   BMI 48.66 kg/m   Physical Exam  Constitutional: He is oriented to person, place, and time. He appears well-developed and well-nourished. No distress.  Morbidly obese  HENT:  Head: Normocephalic and atraumatic.  Right Ear: External ear normal.  Left Ear: External ear normal.  Nose: Nose normal.  No tenderness over temples or forehead  Eyes: Pupils are equal, round, and reactive to light. EOM are normal. Right eye exhibits no discharge. Left eye exhibits no discharge.  Neck: Normal range of motion. Neck supple.  Cardiovascular: Normal rate, regular rhythm and normal heart sounds.   Pulmonary/Chest: Effort normal and breath sounds normal.  Abdominal: Soft. There is no tenderness.  Musculoskeletal: He exhibits no edema.  Neurological: He is alert and oriented to person, place, and time.  CN 3-12 grossly intact. 5/5 strength in all 4 extremities. Grossly normal sensation. Normal finger to nose.   Skin: Skin is warm and dry. He is not diaphoretic.  Nursing note and vitals reviewed.    ED Treatments / Results  Labs (all labs ordered are listed, but only abnormal results are displayed) Labs Reviewed  CBG MONITORING, ED    EKG  EKG Interpretation None       Radiology Ct Head Wo Contrast  Result Date: 11/02/2016 CLINICAL  DATA:  56 year old male with severe headache for 1 day, photophobia. EXAM: CT HEAD WITHOUT CONTRAST TECHNIQUE: Contiguous axial images were obtained from the base of the skull through the vertex without intravenous contrast. COMPARISON:  Head CT without contrast 07/23/2011. FINDINGS: Brain: Cerebral volume is stable and within normal limits. No midline shift, ventriculomegaly, mass effect, evidence of mass lesion, intracranial hemorrhage or evidence of cortically based acute infarction. Gray-white matter differentiation is within normal limits throughout the brain. Vascular: Calcified atherosclerosis at the skull base. No suspicious intracranial vascular hyperdensity. Skull: Negative.  No acute osseous abnormality identified. Sinuses/Orbits: Visualized paranasal sinuses and mastoids are well pneumatized. Other: Stable orbit and scalp soft tissues. IMPRESSION: Stable and normal noncontrast CT appearance of the brain. Electronically Signed   By: Genevie Ann M.D.   On: 11/02/2016 20:54    Procedures Procedures (including critical care time)  Medications Ordered in ED Medications  metoCLOPramide (REGLAN) injection 10 mg (10 mg Intravenous Given 11/02/16 2102)  diphenhydrAMINE (BENADRYL) injection 25 mg (25 mg Intravenous Given 11/02/16 2058)  ketorolac (TORADOL) 15 MG/ML injection 15 mg (15 mg Intravenous Given 11/02/16 2107)  sodium chloride 0.9 % bolus 1,000 mL (0 mLs Intravenous Stopped 11/02/16 2115)     Initial Impression / Assessment and Plan / ED Course  I have reviewed the triage vital signs and the nursing notes.  Pertinent labs & imaging results that were available during my care of the patient were reviewed by me and considered in my medical decision making (see chart for details).     Given the acute onset of his headache, head CT obtained to help rule out subarachnoid hemorrhage. This head CT was obtained within 6 hours of onset of his headache. Thus my suspicion for missed subarachnoid  hemorrhage is quite low. He has no other concerning findings such as meningismus or fever to suggest infectious source. No strokelike symptoms. Headache has significantly improved with treatment and he appears stable for discharge home. Discussed return precautions.  Final Clinical Impressions(s) / ED Diagnoses   Final diagnoses:  Right-sided headache  New Prescriptions Discharge Medication List as of 11/02/2016  9:39 PM       Sherwood Gambler, MD 11/02/16 2349

## 2017-01-07 ENCOUNTER — Encounter: Payer: Medicaid Other | Admitting: Internal Medicine

## 2017-03-04 ENCOUNTER — Ambulatory Visit (INDEPENDENT_AMBULATORY_CARE_PROVIDER_SITE_OTHER): Payer: Medicaid Other | Admitting: Internal Medicine

## 2017-03-04 ENCOUNTER — Other Ambulatory Visit: Payer: Self-pay

## 2017-03-04 ENCOUNTER — Encounter: Payer: Self-pay | Admitting: Internal Medicine

## 2017-03-04 VITALS — BP 133/77 | HR 97 | Temp 98.4°F | Ht 68.0 in | Wt 305.9 lb

## 2017-03-04 DIAGNOSIS — E119 Type 2 diabetes mellitus without complications: Secondary | ICD-10-CM | POA: Diagnosis not present

## 2017-03-04 DIAGNOSIS — I10 Essential (primary) hypertension: Secondary | ICD-10-CM | POA: Diagnosis present

## 2017-03-04 DIAGNOSIS — D126 Benign neoplasm of colon, unspecified: Secondary | ICD-10-CM | POA: Diagnosis not present

## 2017-03-04 DIAGNOSIS — R05 Cough: Secondary | ICD-10-CM | POA: Diagnosis not present

## 2017-03-04 DIAGNOSIS — Z79899 Other long term (current) drug therapy: Secondary | ICD-10-CM | POA: Diagnosis not present

## 2017-03-04 DIAGNOSIS — D649 Anemia, unspecified: Secondary | ICD-10-CM | POA: Diagnosis not present

## 2017-03-04 DIAGNOSIS — Z0000 Encounter for general adult medical examination without abnormal findings: Secondary | ICD-10-CM

## 2017-03-04 DIAGNOSIS — Z7984 Long term (current) use of oral hypoglycemic drugs: Secondary | ICD-10-CM | POA: Diagnosis not present

## 2017-03-04 DIAGNOSIS — R059 Cough, unspecified: Secondary | ICD-10-CM | POA: Insufficient documentation

## 2017-03-04 LAB — GLUCOSE, CAPILLARY: Glucose-Capillary: 153 mg/dL — ABNORMAL HIGH (ref 65–99)

## 2017-03-04 LAB — POCT GLYCOSYLATED HEMOGLOBIN (HGB A1C): HEMOGLOBIN A1C: 5.6

## 2017-03-04 NOTE — Assessment & Plan Note (Signed)
Noted to be chronically anemic on labs.  Colonoscopy done in 2016 showing tubular adenomas with no evidence of high-grade dysplasia or malignancy.  Repeat colonoscopy recommended in 5 years.  Plan -Check CBC, iron, ferritin, and TIBC -He will need a repeat colonoscopy in 2021

## 2017-03-04 NOTE — Assessment & Plan Note (Signed)
Lab Results  Component Value Date   HGBA1C 5.6 03/04/2017   HGBA1C 5.9 04/09/2016   HGBA1C 6.9 01/09/2016     Assessment: Diabetes control:  Well-controlled Comments: Currently on metformin 1000 mg twice daily and Victoza 1.2 mg daily.  Patient has lost 15 pounds in the past 4 months which he attributes to walking regularly, eating less fast food, and eliminating soda from his diet.  Plan: Medications: Stop Victoza.  Continue metformin. Educational resources provided:   Encouraged him to continue making lifestyle changes such as diet and exercise. Other plans: Repeat A1c in 6 months.

## 2017-03-04 NOTE — Progress Notes (Signed)
   CC: Hypertension and diabetes follow-up.  HPI:  Carlos Bean is a 57 y.o. male with past medical history of conditions listed below presenting to the clinic for follow-up of hypertension and diabetes.  Chronic anemia and acute cough were also discussed. Please see problem based charting for the status of the patient's current and chronic medical conditions.   Past Medical History:  Diagnosis Date  . Adenomatous polyp of colon 03/23/2014   Found on colonoscopy 03/14/2014 by Dr. Scarlette Shorts.   . Allergic rhinitis   . Allergy    SEASONAL  . Anemia, unspecified    Mild normocytic anemia; Hgb=12.6 on 07/03/2005  . Arthritis    BACK  . Atypical chest pain    Evaluated by Dr. Fransico Him in 2002; adenosine cardiolite was negative then.  Marland Kitchen BPH (benign prostatic hypertrophy)   . Bronchitis   . Carpal tunnel syndrome    S/P release transverse carpal ligament right wrist and decompression  median nerve right wrist by Dr. Geroge Baseman. Mortenson 02/25/2002.  Marland Kitchen Chronic low back pain   . Constipation   . Depression   . Diabetes mellitus, type 2 ([place])   . Dyspnea on exertion    Spirometry normal 02/26/2001  . Elevated transaminase level    Mildly elevated SGPT  . Genital herpes   . GERD (gastroesophageal reflux disease)   . Hematochezia    Referred to GI 05/17/2009, missed appointment.  . Hyperlipemia   . Hypertension   . Irregular heart beat   . Obesity   . Sigmoid diverticulosis 03/25/2014   Mild diverticulosis was noted in the sigmoid colon on colonoscopy by Dr. Henrene Pastor 03/14/2014   . Sinusitis 06/2011  . Tonsillitis 10/07/2008  . Unspecified disorder of male genital organs    Patient reports occasional bleeding in small amount from area of skin irritation on scrotum, none recent; exam 04/26/2009 normal.   Review of Systems:   Review of Systems  Constitutional: Negative for chills and fever.  Respiratory: Negative for shortness of breath.   Cardiovascular: Negative for leg swelling.    Gastrointestinal: Negative for abdominal pain.    Physical Exam:  Vitals:   03/04/17 1620  BP: 133/77  Pulse: 97  Temp: 98.4 F (36.9 C)  TempSrc: Oral  SpO2: 98%  Weight: (!) 305 lb 14.4 oz (138.8 kg)  Height: 5\' 8"  (1.727 m)   Physical Exam  Constitutional: He is oriented to person, place, and time. He appears well-developed and well-nourished. No distress.  HENT:  Head: Normocephalic and atraumatic.  Mouth/Throat: Oropharynx is clear and moist.  Eyes: Right eye exhibits no discharge. Left eye exhibits no discharge.  Cardiovascular: Normal rate, regular rhythm and intact distal pulses.  Pulmonary/Chest: Effort normal. No respiratory distress. He has no wheezes. He has no rales.  Abdominal: Soft. Bowel sounds are normal. He exhibits no distension. There is no tenderness.  Musculoskeletal: He exhibits no edema.  Neurological: He is alert and oriented to person, place, and time.  Skin: Skin is warm and dry.    Assessment & Plan:   See Encounters Tab for problem based charting.  Patient discussed with Dr. Lynnae January

## 2017-03-04 NOTE — Patient Instructions (Signed)
Mr. Fines it was nice seeing you today.  -Continue taking hydrochlorothiazide and enalapril for high blood pressure  -Stop using Victoza  -Continue taking metformin for diabetes  FOLLOW-UP INSTRUCTIONS When: In 6 months; please return sooner if your cough does not improve For: Blood pressure and diabetes follow-up What to bring: Medications

## 2017-03-04 NOTE — Assessment & Plan Note (Addendum)
Patient states he received his influenza vaccine in November 2018 at his pain clinic.  Advised him to bring records with him to his next visit.

## 2017-03-04 NOTE — Assessment & Plan Note (Signed)
Patient reports having a cough productive of yellow colored sputum, sneezing, and rhinorrhea for the past 7 days.  He does not smoke cigarettes.  Denies having any fevers, chills, or shortness of breath.  States he went to [facility] yesterday and they treated him with a steroid shot and antibiotics.  States they told him he had acute bronchitis and that his flu test was negative. He reports improvement in his cough since yesterday.  Plan -Advised him to call the clinic if his cough does not improve in the next few days -His acute cough is likely secondary to a viral URI or acute bronchitis.  If he continues to have a cough, consider discontinuing ACE inhibitor.

## 2017-03-04 NOTE — Assessment & Plan Note (Signed)
BP Readings from Last 3 Encounters:  03/04/17 133/77  11/02/16 (!) 142/87  04/09/16 129/76    Lab Results  Component Value Date   NA 137 02/03/2016   K 4.1 02/03/2016   CREATININE 1.17 02/03/2016    Assessment: Blood pressure control:  Well-controlled Comments: Currently taking hydrochlorothiazide 25 mg daily and enalapril 5 mg daily.  Plan: Medications:  continue current medications Educational resources provided:   Educated patient about healthy eating and exercise. Emphasized the importance of weight loss.

## 2017-03-05 LAB — CBC
Hematocrit: 41.8 % (ref 37.5–51.0)
Hemoglobin: 13.6 g/dL (ref 13.0–17.7)
MCH: 29.8 pg (ref 26.6–33.0)
MCHC: 32.5 g/dL (ref 31.5–35.7)
MCV: 92 fL (ref 79–97)
PLATELETS: 351 10*3/uL (ref 150–379)
RBC: 4.57 x10E6/uL (ref 4.14–5.80)
RDW: 13.6 % (ref 12.3–15.4)
WBC: 9.3 10*3/uL (ref 3.4–10.8)

## 2017-03-05 LAB — IRON AND TIBC
Iron Saturation: 17 % (ref 15–55)
Iron: 57 ug/dL (ref 38–169)
Total Iron Binding Capacity: 341 ug/dL (ref 250–450)
UIBC: 284 ug/dL (ref 111–343)

## 2017-03-05 LAB — FERRITIN: FERRITIN: 194 ng/mL (ref 30–400)

## 2017-03-05 NOTE — Progress Notes (Signed)
Internal Medicine Clinic Attending  Case discussed with Dr. Rathoreat the time of the visit. We reviewed the resident's history and exam and pertinent patient test results. I agree with the assessment, diagnosis, and plan of care documented in the resident's note.  

## 2017-03-25 ENCOUNTER — Other Ambulatory Visit: Payer: Self-pay | Admitting: *Deleted

## 2017-03-25 DIAGNOSIS — J309 Allergic rhinitis, unspecified: Secondary | ICD-10-CM

## 2017-03-27 ENCOUNTER — Other Ambulatory Visit: Payer: Self-pay | Admitting: *Deleted

## 2017-03-27 DIAGNOSIS — J309 Allergic rhinitis, unspecified: Secondary | ICD-10-CM

## 2017-03-27 NOTE — Telephone Encounter (Addendum)
Second request from Pharmacy for refill on HCTZ, Proventil and fluticasone. Hubbard Hartshorn, RN, BSN

## 2017-03-28 MED ORDER — HYDROCHLOROTHIAZIDE 25 MG PO TABS: 25.0000 mg | ORAL_TABLET | Freq: Every day | ORAL | 3 refills | Status: AC

## 2017-03-28 MED ORDER — ALBUTEROL SULFATE HFA 108 (90 BASE) MCG/ACT IN AERS: INHALATION_SPRAY | RESPIRATORY_TRACT | 0 refills | Status: DC

## 2017-03-28 MED ORDER — FLUTICASONE PROPIONATE 50 MCG/ACT NA SUSP: NASAL | 2 refills | Status: DC

## 2017-06-10 ENCOUNTER — Other Ambulatory Visit: Payer: Self-pay | Admitting: Internal Medicine

## 2017-06-10 DIAGNOSIS — E785 Hyperlipidemia, unspecified: Secondary | ICD-10-CM

## 2017-07-16 ENCOUNTER — Encounter: Payer: Self-pay | Admitting: *Deleted

## 2017-08-02 ENCOUNTER — Other Ambulatory Visit: Payer: Self-pay | Admitting: Internal Medicine

## 2017-08-02 DIAGNOSIS — J309 Allergic rhinitis, unspecified: Secondary | ICD-10-CM

## 2017-08-04 NOTE — Telephone Encounter (Signed)
Next appt scheduled 7/30 with PCP.

## 2017-08-19 ENCOUNTER — Encounter: Payer: Self-pay | Admitting: Internal Medicine

## 2017-08-19 ENCOUNTER — Encounter: Payer: Medicaid Other | Admitting: Internal Medicine

## 2017-08-19 NOTE — Progress Notes (Deleted)
   CC: ***  HPI:  [name] is a 57 y.o.   Past Medical History:  Diagnosis Date  . Adenomatous polyp of colon 03/23/2014   Found on colonoscopy 03/14/2014 by Dr. Scarlette Shorts.   . Allergic rhinitis   . Allergy    SEASONAL  . Anemia, unspecified    Mild normocytic anemia; Hgb=12.6 on 07/03/2005  . Arthritis    BACK  . Atypical chest pain    Evaluated by Dr. Fransico Him in 2002; adenosine cardiolite was negative then.  Marland Kitchen BPH (benign prostatic hypertrophy)   . Bronchitis   . Carpal tunnel syndrome    S/P release transverse carpal ligament right wrist and decompression  median nerve right wrist by Dr. Geroge Baseman. Mortenson 02/25/2002.  Marland Kitchen Chronic low back pain   . Constipation   . Depression   . Diabetes mellitus, type 2 ([place])   . Dyspnea on exertion    Spirometry normal 02/26/2001  . Elevated transaminase level    Mildly elevated SGPT  . Genital herpes   . GERD (gastroesophageal reflux disease)   . Hematochezia    Referred to GI 05/17/2009, missed appointment.  . Hyperlipemia   . Hypertension   . Irregular heart beat   . Obesity   . Sigmoid diverticulosis 03/25/2014   Mild diverticulosis was noted in the sigmoid colon on colonoscopy by Dr. Henrene Pastor 03/14/2014   . Sinusitis 06/2011  . Tonsillitis 10/07/2008  . Unspecified disorder of male genital organs    Patient reports occasional bleeding in small amount from area of skin irritation on scrotum, none recent; exam 04/26/2009 normal.    Review of Systems:   *** Constitutional: Negative for chills and fever.  HENT: Negative for congestion, ear discharge, ear pain, sinus pain and sore throat.   Respiratory: Negative for cough and shortness of breath.   Cardiovascular: Negative for chest pain and palpitations.  Gastrointestinal: Negative for abdominal pain, nausea and vomiting.  Skin: Negative for itching and rash.    Physical Exam:  There were no vitals filed for this visit. *** Physical Exam  Constitutional:  Well-developed, well-nourished, and in no distress.  Eyes: Pupils are equal, round, and reactive to light. EOM are normal.  Cardiovascular: Normal rate and regular rhythm. No murmurs, rubs, or gallops. Pulmonary/Chest: Effort normal. Clear to auscultation bilaterally. No wheezes, rales, or rhonchi. Abdominal: Bowel sounds present. Soft, non-distended, non-tender. Ext: No lower extremity edema. Skin: Warm and dry. No rashes or wounds.   Assessment & Plan:   See Encounters Tab for problem based charting.  Patient {GC/GE:[id number]::"discussed with","seen with"} Dr. {NAMES:[id number]::"Butcher","Granfortuna","E. Hoffman","Klima","Mullen","Narendra","Raines","Vincent"}

## 2017-10-14 ENCOUNTER — Encounter: Payer: Medicaid Other | Admitting: Internal Medicine

## 2017-11-18 ENCOUNTER — Encounter: Payer: Medicaid Other | Admitting: Internal Medicine

## 2017-11-18 ENCOUNTER — Encounter: Payer: Self-pay | Admitting: Internal Medicine

## 2017-12-03 ENCOUNTER — Other Ambulatory Visit: Payer: Self-pay | Admitting: Internal Medicine

## 2017-12-03 NOTE — Telephone Encounter (Signed)
Pt appt 12/15/2017 @ 1:15pm.

## 2017-12-15 ENCOUNTER — Ambulatory Visit: Payer: Medicaid Other

## 2017-12-15 ENCOUNTER — Encounter: Payer: Self-pay | Admitting: Internal Medicine

## 2017-12-23 ENCOUNTER — Telehealth: Payer: Self-pay | Admitting: Dietician

## 2017-12-24 NOTE — Telephone Encounter (Signed)
I was asked to call patient to schedule an appointment. I was unable to reach patient in only phone number listed in his chart for both he and his wife. A letter has been sent to his address by the front office to call the office for an appointment.  Debera Lat, RD 12/24/2017 5:45 PM.

## 2017-12-25 ENCOUNTER — Encounter: Payer: Self-pay | Admitting: Internal Medicine

## 2017-12-26 ENCOUNTER — Encounter: Payer: Self-pay | Admitting: Internal Medicine

## 2018-04-02 ENCOUNTER — Other Ambulatory Visit: Payer: Self-pay | Admitting: Internal Medicine

## 2018-04-03 ENCOUNTER — Other Ambulatory Visit: Payer: Self-pay | Admitting: Internal Medicine

## 2018-04-03 DIAGNOSIS — J309 Allergic rhinitis, unspecified: Secondary | ICD-10-CM

## 2018-04-03 NOTE — Telephone Encounter (Signed)
He was dismissed in Dec

## 2018-04-30 ENCOUNTER — Other Ambulatory Visit: Payer: Self-pay | Admitting: Internal Medicine

## 2018-04-30 DIAGNOSIS — J309 Allergic rhinitis, unspecified: Secondary | ICD-10-CM

## 2020-03-20 ENCOUNTER — Encounter (HOSPITAL_BASED_OUTPATIENT_CLINIC_OR_DEPARTMENT_OTHER): Payer: Self-pay

## 2020-03-20 ENCOUNTER — Other Ambulatory Visit: Payer: Self-pay

## 2020-03-20 ENCOUNTER — Emergency Department (HOSPITAL_BASED_OUTPATIENT_CLINIC_OR_DEPARTMENT_OTHER)
Admission: EM | Admit: 2020-03-20 | Discharge: 2020-03-20 | Disposition: A | Discharge status: Home / Self Care | Payer: Medicaid Other | Attending: Emergency Medicine | Admitting: Emergency Medicine

## 2020-03-20 ENCOUNTER — Emergency Department (HOSPITAL_BASED_OUTPATIENT_CLINIC_OR_DEPARTMENT_OTHER): Payer: Medicaid Other

## 2020-03-20 DIAGNOSIS — E119 Type 2 diabetes mellitus without complications: Secondary | ICD-10-CM | POA: Diagnosis not present

## 2020-03-20 DIAGNOSIS — Z794 Long term (current) use of insulin: Secondary | ICD-10-CM | POA: Diagnosis not present

## 2020-03-20 DIAGNOSIS — Z7984 Long term (current) use of oral hypoglycemic drugs: Secondary | ICD-10-CM | POA: Diagnosis not present

## 2020-03-20 DIAGNOSIS — S39012A Strain of muscle, fascia and tendon of lower back, initial encounter: Secondary | ICD-10-CM | POA: Insufficient documentation

## 2020-03-20 DIAGNOSIS — Z87891 Personal history of nicotine dependence: Secondary | ICD-10-CM | POA: Diagnosis not present

## 2020-03-20 DIAGNOSIS — Z79899 Other long term (current) drug therapy: Secondary | ICD-10-CM | POA: Insufficient documentation

## 2020-03-20 DIAGNOSIS — I10 Essential (primary) hypertension: Secondary | ICD-10-CM | POA: Diagnosis not present

## 2020-03-20 DIAGNOSIS — W19XXXA Unspecified fall, initial encounter: Secondary | ICD-10-CM | POA: Diagnosis not present

## 2020-03-20 DIAGNOSIS — S3992XA Unspecified injury of lower back, initial encounter: Secondary | ICD-10-CM | POA: Diagnosis present

## 2020-03-20 DIAGNOSIS — Z7982 Long term (current) use of aspirin: Secondary | ICD-10-CM | POA: Diagnosis not present

## 2020-03-20 LAB — URINALYSIS, ROUTINE W REFLEX MICROSCOPIC
Bilirubin Urine: NEGATIVE
Glucose, UA: NEGATIVE mg/dL
Hgb urine dipstick: NEGATIVE
Ketones, ur: NEGATIVE mg/dL
Leukocytes,Ua: NEGATIVE
Nitrite: NEGATIVE
Protein, ur: NEGATIVE mg/dL
Specific Gravity, Urine: 1.02 (ref 1.005–1.030)
pH: 6 (ref 5.0–8.0)

## 2020-03-20 MED ORDER — OXYCODONE-ACETAMINOPHEN 5-325 MG PO TABS
1.0000 | ORAL_TABLET | Freq: Once | ORAL | Status: AC
  Administered 2020-03-20: 1 via ORAL
  Filled 2020-03-20: qty 1

## 2020-03-20 MED ORDER — METHOCARBAMOL 500 MG PO TABS: 500.0000 mg | ORAL_TABLET | Freq: Two times a day (BID) | ORAL | 0 refills | Status: AC

## 2020-03-20 MED ORDER — ACETAMINOPHEN 325 MG PO TABS
650.0000 mg | ORAL_TABLET | Freq: Once | ORAL | Status: AC
  Administered 2020-03-20: 650 mg via ORAL
  Filled 2020-03-20: qty 2

## 2020-03-20 NOTE — ED Provider Notes (Signed)
[facility] DEPARTMENT Provider Note   CSN: [id number] Arrival date & time: 03/20/20  1236     History Chief Complaint  Patient presents with  . Back Pain    Carlos Bean is a 60 y.o. male.  HPI Patient is 60 year old male presented today with back pain after fall that occurred 5 days ago which is from a mechanical fall.  He states that he noticed some blood in his urine once this past week.  He has no history of kidney stones and denies any flank pain or urinary issues other than the single episode of hematuria.  Patient states his back pain is achy and constant seems to have been consistent since his fall.  He denies any weakness but states that it hurts when he moves.  He states that he has however been able to walk around.  He denies any bowel or bladder incontinence fevers, history of IV drug use, history of cancer, saddle anesthesia or any other numbness or weakness.  He states his pain is worse with bending over and moving.  No other associated symptoms.  He has taken no medications prior to arrival.     Past Medical History:  Diagnosis Date  . Adenomatous polyp of colon 03/23/2014   Found on colonoscopy 03/14/2014 by Dr. Scarlette Shorts.   . Allergic rhinitis   . Allergy    SEASONAL  . Anemia, unspecified    Mild normocytic anemia; Hgb=12.6 on 07/03/2005  . Arthritis    BACK  . Atypical chest pain    Evaluated by Dr. Fransico Him in 2002; adenosine cardiolite was negative then.  Marland Kitchen BPH (benign prostatic hypertrophy)   . Bronchitis   . Carpal tunnel syndrome    S/P release transverse carpal ligament right wrist and decompression  median nerve right wrist by Dr. Geroge Baseman. Mortenson 02/25/2002.  Marland Kitchen Chronic low back pain   . Constipation   . Depression   . Diabetes mellitus, type 2 ([place])   . Dyspnea on exertion    Spirometry normal 02/26/2001  . Elevated transaminase level    Mildly elevated SGPT  . Genital herpes   . GERD (gastroesophageal reflux  disease)   . Hematochezia    Referred to GI 05/17/2009, missed appointment.  . Hyperlipemia   . Hypertension   . Irregular heart beat   . Obesity   . Sigmoid diverticulosis 03/25/2014   Mild diverticulosis was noted in the sigmoid colon on colonoscopy by Dr. Henrene Pastor 03/14/2014   . Sinusitis 06/2011  . Tonsillitis 10/07/2008  . Unspecified disorder of male genital organs    Patient reports occasional bleeding in small amount from area of skin irritation on scrotum, none recent; exam 04/26/2009 normal.    Patient Active Problem List   Diagnosis Date Noted  . Cough 03/04/2017  . Morbid obesity ([place]) 04/10/2016  . Ear pain, right 04/10/2016  . Long term current use of opiate analgesic 01/01/2016  . Sigmoid diverticulosis 03/25/2014  . Adenomatous polyp of colon 03/23/2014  . Preventative health care 08/19/2012  . BPH (benign prostatic hyperplasia) 03/13/2011  . Anemia 04/27/2009  . Genital herpes 06/28/2008  . Hyperlipidemia 10/28/2007  . Diabetes mellitus type II, controlled ([place]) 10/14/2007  . Allergic rhinitis 02/10/2006  . Depression 12/04/2005  . Essential hypertension 12/04/2005  . GERD 12/04/2005  . LOW BACK PAIN, CHRONIC 12/04/2005    Past Surgical History:  Procedure Laterality Date  . CARPAL TUNNEL RELEASE  02/25/2002   S/P release transverse carpal  ligament right wrist and decompression  median nerve right wrist by Dr. Geroge Baseman. Mortenson 02/25/2002.  Marland Kitchen epidural sterior injections     spine       Family History  Problem Relation Age of Onset  . Breast cancer Sister        2 sisters with breast cancer  . Prostate cancer Brother 14  . Colon polyps Brother   . Diabetes type II Mother   . Kidney failure Mother   . Stroke Father        Late 73s  . Colon cancer Paternal Aunt 80    Social History   Tobacco Use  . Smoking status: Former Smoker    Packs/day: 0.50    Years: 5.00    Pack years: 2.50    Quit date: 01/21/1990    Years since quitting: 30.1  . Smokeless  tobacco: Never Used  Substance Use Topics  . Alcohol use: No    Alcohol/week: 0.0 standard drinks  . Drug use: No    Home Medications Prior to Admission medications   Medication Sig Start Date End Date Taking? Authorizing Provider  methocarbamol (ROBAXIN) 500 MG tablet Take 1 tablet (500 mg total) by mouth 2 (two) times daily. 03/20/20  Yes Senetra Dillin S, PA  albuterol (PROAIR HFA) 108 (90 Base) MCG/ACT inhaler INHALE 2 PUFFS INTO THE LUNGS 4 TIMES A DAY AS NEEDED FOR WHEEZING OR SHORTNESS OF BREATH 08/04/17   Sid Falcon, MD  aspirin EC 81 MG tablet Take 81 mg by mouth daily.    [provider]  atorvastatin (LIPITOR) 40 MG tablet TAKE 1 TABLET (40 MG TOTAL) BY MOUTH DAILY. 06/10/17   Shela Leff, MD  cetirizine (ZYRTEC) 10 MG tablet TAKE 1 TABLET (10 MG TOTAL) BY MOUTH DAILY. 06/05/16   Shela Leff, MD  enalapril (VASOTEC) 5 MG tablet Take 1 tablet (5 mg total) by mouth daily. 04/09/16   Shela Leff, MD  finasteride (PROSCAR) 5 MG tablet TAKE 1 TABLET BY MOUTH DAILY 07/09/16   Shela Leff, MD  fluticasone [facility]) 50 MCG/ACT nasal spray SPRAY 2 SPRAYS INTO EACH NOSTRIL EVERY DAY 08/04/17   Gilles Chiquito B, MD  glucose blood test strip 1 each by Other route as needed for other. Use as instructed    [provider]  hydrochlorothiazide (HYDRODIURIL) 25 MG tablet Take 1 tablet (25 mg total) by mouth daily. 03/28/17   Shela Leff, MD  Insulin Pen Needle (PEN NEEDLES) 31G X 5 MM MISC 1 each by Does not apply route 2 (two) times daily. 01/17/16   Sid Falcon, MD  metFORMIN (GLUCOPHAGE) 1000 MG tablet TAKE 1 TABLET (1,000 MG TOTAL) BY MOUTH 2 (TWO) TIMES DAILY WITH A MEAL. 05/22/16   Shela Leff, MD  omeprazole (PRILOSEC) 20 MG capsule TAKE ONE CAPSULE BY MOUTH TWICE A DAY BEFORE A MEAL 04/09/16   Shela Leff, MD  PARoxetine (PAXIL) 20 MG tablet TAKE 1 TABLET (20 MG TOTAL) BY MOUTH DAILY. 07/09/16   Shela Leff, MD   ranitidine (ZANTAC) 150 MG capsule Take 1 capsule (150 mg total) by mouth every evening. 04/09/16   Shela Leff, MD  traMADol (ULTRAM) 50 MG tablet Take 1 tablet (50 mg total) by mouth every 8 (eight) hours as needed. for pain. 03/06/16   Shela Leff, MD  valACYclovir (VALTREX) 500 MG tablet TAKE 1 TABLET (500 MG TOTAL) BY MOUTH DAILY. 07/09/16   Shela Leff, MD    Allergies    Clarithromycin  Review  of Systems   Review of Systems  Constitutional: Negative for chills and fever.  HENT: Negative for congestion.   Respiratory: Negative for shortness of breath.   Cardiovascular: Negative for chest pain.  Gastrointestinal: Negative for abdominal pain.  Musculoskeletal: Positive for back pain. Negative for neck pain.    Physical Exam Updated Vital Signs BP 126/72 (BP Location: Right Arm)   Pulse 78   Temp 98.2 F (36.8 C) (Oral)   Resp 18   Ht 5\' 8"  (1.727 m)   Wt 127 kg   SpO2 98%   BMI 42.57 kg/m   Physical Exam Vitals and nursing note reviewed.  Constitutional:      General: He is not in acute distress.    Appearance: Normal appearance. He is not ill-appearing.  HENT:     Head: Normocephalic and atraumatic.     Mouth/Throat:     Mouth: Mucous membranes are moist.  Eyes:     General: No scleral icterus.       Right eye: No discharge.        Left eye: No discharge.     Conjunctiva/sclera: Conjunctivae normal.  Cardiovascular:     Rate and Rhythm: Normal rate.     Comments: Bilateral radial artery pulse 3+ and symmetric Pulmonary:     Effort: Pulmonary effort is normal.     Breath sounds: No stridor.  Abdominal:     Tenderness: There is no abdominal tenderness. There is no guarding or rebound.  Musculoskeletal:     Comments: No significant midline tenderness to palpation of the lumbar spine.  There is some right-sided paralumbar muscular tenderness with single trigger point.  Skin:    General: Skin is warm and dry.     Comments: No rashes   Neurological:     Mental Status: He is alert and oriented to person, place, and time. Mental status is at baseline.     Comments: Sensation intact bilateral lower extremities Moves all 4 extremities spontaneously without difficulty  Psychiatric:        Mood and Affect: Mood normal.        Behavior: Behavior normal.     ED Results / Procedures / Treatments   Labs (all labs ordered are listed, but only abnormal results are displayed) Labs Reviewed  URINALYSIS, ROUTINE W REFLEX MICROSCOPIC    EKG None  Radiology DG Lumbar Spine Complete  Result Date: 03/20/2020 CLINICAL DATA:  Recent fall several days ago with low back pain, initial encounter EXAM: LUMBAR SPINE - COMPLETE 4+ VIEW COMPARISON:  None. FINDINGS: Five lumbar type vertebral bodies are well visualized. Vertebral body height is well maintained. Mild osteophytic changes are noted. Facet hypertrophic changes are noted as well. No pars defects are noted. No anterolisthesis is seen. No soft tissue abnormality is noted. IMPRESSION: Mild degenerative change without acute abnormality Electronically Signed   By: Inez Catalina M.D.   On: 03/20/2020 15:21    Procedures Procedures   Medications Ordered in ED Medications  oxyCODONE-acetaminophen (PERCOCET/ROXICET) 5-325 MG per tablet 1 tablet (1 tablet Oral Given 03/20/20 1519)  acetaminophen (TYLENOL) tablet 650 mg (650 mg Oral Given 03/20/20 1518)    ED Course  I have reviewed the triage vital signs and the nursing notes.  Pertinent labs & imaging results that were available during my care of the patient were reviewed by me and considered in my medical decision making (see chart for details).    MDM Rules/Calculators/A&P  Patient is 60 year old male presented today with back pain after fall that occurred 5 days ago which is from a mechanical fall.  He states that he noticed some blood in his urine once this past week.  He has no history of kidney stones  and denies any flank pain or urinary issues other than the single episode of hematuria.  Physical exam is unremarkable apart from some right-sided paravertebral lumbar muscular tenderness to palpation.  He has good sensation and movement of his lower extremities.  He does appear to be quite uncomfortable.  Vital signs within normal limits.  Given that this is traumatic and has occurred 5 days ago will obtain x-rays to rule out compression fracture of any significance.  No head injury or loss of consciousness.  Patient is otherwise well-appearing.  Urinalysis obtained to evaluate for hematuria is negative for microscopic hematuria.  Urine is without any evidence of gross abnormality on my visual inspection.  He will follow-up with PCP for concerns for this.  Broad differential for back pain considered includes malignancy, disc herniation, spinal epidural abscess, spinal fracture, cauda equina, pyelonephritis, kidney stone, AAA, AD, pancreatitis, PE and PTX.   History without symptoms of urinary or stool retention or incontinence, neurologic changes such as sensation change or weakness lower extremities, coagulopathy or blood thinner use, is not elderly or with history of osteoporosis, denies any history of cancer, fever, IV drug use, weight changes (unexplained), or prolonged steroid use.   Physical exam most consistent with muscular strain. Doubt cauda equina or disc herniation d/t lack of saddle anesthesia/bowel or bladder incontinence or urinary retention, normal gait and reassuring physical examination without neurologic deficits.   History is not supportive of kidney stone, AAA, AD, pancreatitis, PE or PTX. Patient has no CVA tenderness or urinary sx to suggest pyelonephritis or kidney stone.   Will manage patient conservatively at this time. NSAIDs, back exercises/stretches, heat therapy and follow up with PCP if symptoms do not resolve in 3-4 weeks. Patient offered muscle relaxer for comfort  at night. Counseled on need to return to ED for fever, worsening or concerning symptoms. Patient agreeable to plan and states understanding of follow up plans and return precautions.    Vitals WNL at time of discharge and patient is no acute distress  Patient provided with 1 tablet of Percocet.  Will discharge with Tylenol recommendations and Robaxin prescription.  X-ray negative for fracture.  Discharged with follow-up with Dr. Raeford Razor with sports medicine and his PCP.   Final Clinical Impression(s) / ED Diagnoses Final diagnoses:  Fall, initial encounter  Strain of lumbar region, initial encounter    Rx / DC Orders ED Discharge Orders         Ordered    methocarbamol (ROBAXIN) 500 MG tablet  2 times daily        03/20/20 1458           Tedd Sias, [place] 03/20/20 [place], [place], DO 03/21/20 [phone]

## 2020-03-20 NOTE — ED Triage Notes (Signed)
Pt arrives with c/o pain to right lower back since Wednesday pt did have a fall reports that he has had some blood in his urine since.

## 2020-03-20 NOTE — Discharge Instructions (Addendum)
Your x-ray was negative for fracture.  As we discussed the most helpful treatment for back pain is gentle stretching, gentle exercise, strengthening exercises, warm compresses, warm salt water soaks.  Please do plenty of water.  You may also schedule appointment for massage as this can also be very helpful.  Please take Tylenol 1000 mg every 6 hours.  You may use Robaxin I prescribed you for pain control as well.

## 2020-11-24 ENCOUNTER — Other Ambulatory Visit: Payer: Self-pay

## 2020-11-24 ENCOUNTER — Emergency Department (HOSPITAL_BASED_OUTPATIENT_CLINIC_OR_DEPARTMENT_OTHER)
Admission: EM | Admit: 2020-11-24 | Discharge: 2020-11-24 | Disposition: A | Discharge status: Home / Self Care | Payer: Medicaid Other | Attending: Emergency Medicine | Admitting: Emergency Medicine

## 2020-11-24 ENCOUNTER — Encounter (HOSPITAL_BASED_OUTPATIENT_CLINIC_OR_DEPARTMENT_OTHER): Payer: Self-pay | Admitting: *Deleted

## 2020-11-24 DIAGNOSIS — Z79899 Other long term (current) drug therapy: Secondary | ICD-10-CM | POA: Insufficient documentation

## 2020-11-24 DIAGNOSIS — E1169 Type 2 diabetes mellitus with other specified complication: Secondary | ICD-10-CM | POA: Insufficient documentation

## 2020-11-24 DIAGNOSIS — R000 Tachycardia, unspecified: Secondary | ICD-10-CM | POA: Insufficient documentation

## 2020-11-24 DIAGNOSIS — Z7982 Long term (current) use of aspirin: Secondary | ICD-10-CM | POA: Diagnosis not present

## 2020-11-24 DIAGNOSIS — I10 Essential (primary) hypertension: Secondary | ICD-10-CM | POA: Diagnosis not present

## 2020-11-24 DIAGNOSIS — U071 COVID-19: Secondary | ICD-10-CM | POA: Insufficient documentation

## 2020-11-24 DIAGNOSIS — Z87891 Personal history of nicotine dependence: Secondary | ICD-10-CM | POA: Diagnosis not present

## 2020-11-24 DIAGNOSIS — E785 Hyperlipidemia, unspecified: Secondary | ICD-10-CM | POA: Diagnosis not present

## 2020-11-24 DIAGNOSIS — Z7984 Long term (current) use of oral hypoglycemic drugs: Secondary | ICD-10-CM | POA: Diagnosis not present

## 2020-11-24 DIAGNOSIS — R5081 Fever presenting with conditions classified elsewhere: Secondary | ICD-10-CM

## 2020-11-24 DIAGNOSIS — R509 Fever, unspecified: Secondary | ICD-10-CM | POA: Diagnosis present

## 2020-11-24 LAB — RESP PANEL BY RT-PCR (FLU A&B, COVID) ARPGX2
Influenza A by PCR: NEGATIVE
Influenza B by PCR: NEGATIVE
SARS Coronavirus 2 by RT PCR: POSITIVE — AB

## 2020-11-24 MED ORDER — ACETAMINOPHEN 160 MG/5ML PO SOLN
ORAL | Status: AC
  Filled 2020-11-24: qty 20.3

## 2020-11-24 MED ORDER — ACETAMINOPHEN 160 MG/5ML PO SOLN
650.0000 mg | Freq: Once | ORAL | Status: AC
  Administered 2020-11-24: 650 mg via ORAL

## 2020-11-24 NOTE — ED Provider Notes (Signed)
[facility] Provider Note   CSN: [id number] Arrival date & time: 11/24/20  1847     History Chief Complaint  Patient presents with   Fever    Carlos Bean is a 60 y.o. male.  With history of diabetes and hypertension presents today with complaint of cough and headache.  States symptoms began 2 days ago, has been having associated fevers and congestion.  He has not taken anything for his symptoms.  Denies chest pain, shortness of breath, nausea, vomiting.  Does state that his wife's in-home CNA has been sick with similar symptoms as well as his wife.  Yesterday, patient states that he has had diminished appetite and decreased p.o. intake.  No loss of smell or taste.  Patient does state that he got the first 2 COVID vaccinations but has not had a booster.  The history is provided by the patient. No language interpreter was used.  Fever Associated symptoms: congestion, cough, headaches and rhinorrhea   Associated symptoms: no chest pain, no confusion, no diarrhea, no dysuria, no nausea and no vomiting       Past Medical History:  Diagnosis Date   Adenomatous polyp of colon 03/23/2014   Found on colonoscopy 03/14/2014 by Dr. Scarlette Shorts.    Allergic rhinitis    Allergy    SEASONAL   Anemia, unspecified    Mild normocytic anemia; Hgb=12.6 on 07/03/2005   Arthritis    BACK   Atypical chest pain    Evaluated by Dr. Fransico Him in 2002; adenosine cardiolite was negative then.   BPH (benign prostatic hypertrophy)    Bronchitis    Carpal tunnel syndrome    S/P release transverse carpal ligament right wrist and decompression  median nerve right wrist by Dr. Geroge Baseman. Mortenson 02/25/2002.   Chronic low back pain    Constipation    Depression    Diabetes mellitus, type 2 ([facility])    Dyspnea on exertion    Spirometry normal 02/26/2001   Elevated transaminase level    Mildly elevated SGPT   Genital herpes    GERD (gastroesophageal reflux disease)     Hematochezia    Referred to GI 05/17/2009, missed appointment.   Hyperlipemia    Hypertension    Irregular heart beat    Obesity    Sigmoid diverticulosis 03/25/2014   Mild diverticulosis was noted in the sigmoid colon on colonoscopy by Dr. Henrene Pastor 03/14/2014    Sinusitis 06/2011   Tonsillitis 10/07/2008   Unspecified disorder of male genital organs    Patient reports occasional bleeding in small amount from area of skin irritation on scrotum, none recent; exam 04/26/2009 normal.    Patient Active Problem List   Diagnosis Date Noted   Cough 03/04/2017   Morbid obesity ([place]) 04/10/2016   Ear pain, right 04/10/2016   Long term current use of opiate analgesic 01/01/2016   Sigmoid diverticulosis 03/25/2014   Adenomatous polyp of colon 03/23/2014   Preventative health care 08/19/2012   BPH (benign prostatic hyperplasia) 03/13/2011   Anemia 04/27/2009   Genital herpes 06/28/2008   Hyperlipidemia 10/28/2007   Diabetes mellitus type II, controlled ([place]) 10/14/2007   Allergic rhinitis 02/10/2006   Depression 12/04/2005   Essential hypertension 12/04/2005   GERD 12/04/2005   LOW BACK PAIN, CHRONIC 12/04/2005    Past Surgical History:  Procedure Laterality Date   CARPAL TUNNEL RELEASE  02/25/2002   S/P release transverse carpal ligament right wrist and decompression  median nerve right wrist  by Dr. Geroge Baseman. Mortenson 02/25/2002.   epidural sterior injections     spine       Family History  Problem Relation Age of Onset   Breast cancer Sister        2 sisters with breast cancer   Prostate cancer Brother 1   Colon polyps Brother    Diabetes type II Mother    Kidney failure Mother    Stroke Father        Late 42s   Colon cancer Paternal Aunt 66    Social History   Tobacco Use   Smoking status: Former    Packs/day: 0.50    Years: 5.00    Pack years: 2.50    Types: Cigarettes    Quit date: 01/21/1990    Years since quitting: 30.8   Smokeless tobacco: Never  Substance Use  Topics   Alcohol use: No    Alcohol/week: 0.0 standard drinks   Drug use: No    Home Medications Prior to Admission medications   Medication Sig Start Date End Date Taking? Authorizing Provider  albuterol (PROAIR HFA) 108 (90 Base) MCG/ACT inhaler INHALE 2 PUFFS INTO THE LUNGS 4 TIMES A DAY AS NEEDED FOR WHEEZING OR SHORTNESS OF BREATH 08/04/17   Sid Falcon, MD  aspirin EC 81 MG tablet Take 81 mg by mouth daily.    [provider]  atorvastatin (LIPITOR) 40 MG tablet TAKE 1 TABLET (40 MG TOTAL) BY MOUTH DAILY. 06/10/17   Shela Leff, MD  cetirizine (ZYRTEC) 10 MG tablet TAKE 1 TABLET (10 MG TOTAL) BY MOUTH DAILY. 06/05/16   Shela Leff, MD  enalapril (VASOTEC) 5 MG tablet Take 1 tablet (5 mg total) by mouth daily. 04/09/16   Shela Leff, MD  finasteride (PROSCAR) 5 MG tablet TAKE 1 TABLET BY MOUTH DAILY 07/09/16   Shela Leff, MD  fluticasone [facility]) 50 MCG/ACT nasal spray SPRAY 2 SPRAYS INTO EACH NOSTRIL EVERY DAY 08/04/17   Gilles Chiquito B, MD  glucose blood test strip 1 each by Other route as needed for other. Use as instructed    [provider]  hydrochlorothiazide (HYDRODIURIL) 25 MG tablet Take 1 tablet (25 mg total) by mouth daily. 03/28/17   Shela Leff, MD  Insulin Pen Needle (PEN NEEDLES) 31G X 5 MM MISC 1 each by Does not apply route 2 (two) times daily. 01/17/16   Sid Falcon, MD  metFORMIN (GLUCOPHAGE) 1000 MG tablet TAKE 1 TABLET (1,000 MG TOTAL) BY MOUTH 2 (TWO) TIMES DAILY WITH A MEAL. 05/22/16   Shela Leff, MD  methocarbamol (ROBAXIN) 500 MG tablet Take 1 tablet (500 mg total) by mouth 2 (two) times daily. 03/20/20   Tedd Sias, PA  omeprazole (PRILOSEC) 20 MG capsule TAKE ONE CAPSULE BY MOUTH TWICE A DAY BEFORE A MEAL 04/09/16   Shela Leff, MD  PARoxetine (PAXIL) 20 MG tablet TAKE 1 TABLET (20 MG TOTAL) BY MOUTH DAILY. 07/09/16   Shela Leff, MD  ranitidine (ZANTAC) 150 MG capsule Take 1 capsule  (150 mg total) by mouth every evening. 04/09/16   Shela Leff, MD  traMADol (ULTRAM) 50 MG tablet Take 1 tablet (50 mg total) by mouth every 8 (eight) hours as needed. for pain. 03/06/16   Shela Leff, MD  valACYclovir (VALTREX) 500 MG tablet TAKE 1 TABLET (500 MG TOTAL) BY MOUTH DAILY. 07/09/16   Shela Leff, MD    Allergies    Clarithromycin  Review of Systems   Review of Systems  Constitutional:  Positive for appetite change and fever.  HENT:  Positive for congestion and rhinorrhea.   Respiratory:  Positive for cough. Negative for shortness of breath, wheezing and stridor.   Cardiovascular:  Negative for chest pain, palpitations and leg swelling.  Gastrointestinal:  Negative for abdominal pain, diarrhea, nausea and vomiting.  Genitourinary:  Negative for difficulty urinating and dysuria.  Musculoskeletal:  Negative for neck pain and neck stiffness.  Neurological:  Positive for headaches. Negative for dizziness, tremors, seizures, syncope, facial asymmetry, speech difficulty, weakness, light-headedness and numbness.  Psychiatric/Behavioral:  Negative for confusion and decreased concentration.   All other systems reviewed and are negative.  Physical Exam Updated Vital Signs BP 112/66 (BP Location: Left Arm)   Pulse (!) 126   Temp 98.8 F (37.1 C) (Oral)   Resp 16   Ht 5\' 8"  (1.727 m)   Wt 127 kg   SpO2 100%   BMI 42.57 kg/m   Physical Exam Vitals and nursing note reviewed.  Constitutional:      General: He is not in acute distress.    Appearance: Normal appearance. He is obese. He is not ill-appearing, toxic-appearing or diaphoretic.     Comments: Patient well-appearing, sitting exam chair in no acute distress.  HENT:     Head: Normocephalic and atraumatic.     Nose: Nose normal.     Mouth/Throat:     Mouth: Mucous membranes are moist.  Eyes:     Extraocular Movements: Extraocular movements intact.     Pupils: Pupils are equal, round, and reactive to  light.  Cardiovascular:     Rate and Rhythm: Regular rhythm. Tachycardia present.     Heart sounds: Normal heart sounds.  Pulmonary:     Effort: Pulmonary effort is normal. No respiratory distress.     Breath sounds: Normal breath sounds. No stridor. No wheezing, rhonchi or rales.  Chest:     Chest wall: No tenderness.  Abdominal:     General: Abdomen is flat. Bowel sounds are normal.     Palpations: Abdomen is soft.  Musculoskeletal:        General: Normal range of motion.     Cervical back: Normal range of motion and neck supple.  Skin:    General: Skin is warm and dry.  Neurological:     General: No focal deficit present.     Mental Status: He is alert.  Psychiatric:        Mood and Affect: Mood normal.        Behavior: Behavior normal.    ED Results / Procedures / Treatments   Labs (all labs ordered are listed, but only abnormal results are displayed) Labs Reviewed  RESP PANEL BY RT-PCR (FLU A&B, COVID) ARPGX2 - Abnormal; Notable for the following components:      Result Value   SARS Coronavirus 2 by RT PCR POSITIVE (*)    All other components within normal limits    EKG None  Radiology No results found.  Procedures Procedures   Medications Ordered in ED Medications  acetaminophen (TYLENOL) 160 MG/5ML solution (  Not Given 11/24/20 1957)  acetaminophen (TYLENOL) 160 MG/5ML solution 650 mg (650 mg Oral Given 11/24/20 1924)    ED Course  I have reviewed the triage vital signs and the nursing notes.  Pertinent labs & imaging results that were available during my care of the patient were reviewed by me and considered in my medical decision making (see chart for details).    MDM  Rules/Calculators/A&P                         Patient presents today with URI symptoms and headache.  He is COVID-positive.  Discussed that antibiotics are not indicated for viral infections.  Patient is awake, alert, speaking in complete sentences, and in no acute distress.  Oxygen  saturation remains 100% on room air throughout visit.  Lungs are clear to auscultation in all fields, therefore chest x-ray not indicated at this time.  Fever managed with Tylenol.  Pt will be discharged with additional symptomatic treatment and educated on red flag symptoms that would prompt return.  Verbalizes understanding and is agreeable with plan. Pt is hemodynamically stable & in NAD prior to dc.   Final Clinical Impression(s) / ED Diagnoses Final diagnoses:  Fever in other diseases  COVID    Rx / DC Orders ED Discharge Orders     None     An After Visit Summary was printed and given to the patient.    Nestor Lewandowsky 11/24/20 2131    Fredia Sorrow, MD 12/08/20 [phone]

## 2020-11-24 NOTE — ED Notes (Signed)
Notified provider of pt's COVID+ status

## 2020-11-24 NOTE — ED Triage Notes (Signed)
C/o chills, cough , fever, body aches, h/a x 2 days

## 2020-11-24 NOTE — Discharge Instructions (Addendum)
You were diagnosed with COVID in the emergency department today.  This is a viral illness, and therefore antibiotics are not indicated.  Treatment for this is supportive care with plenty of fluids and Tylenol every 6 hours as needed for fevers.  Follow-up with your primary care in the next few days for continued symptom management.  Return if development of respiratory distress or significant shortness of breath or any other new or worsening symptoms.

## 2021-10-12 ENCOUNTER — Ambulatory Visit: Payer: Medicaid Other | Admitting: Internal Medicine
